# Patient Record
Sex: Male | Born: 1990 | Race: White | Hispanic: No | Marital: Single | Smoking: Never smoker
Health system: Southern US, Community
[De-identification: ages and names within clinical notes are randomized; demographics above are authoritative.]

## PROBLEM LIST (undated history)

## (undated) DIAGNOSIS — F1190 Opioid use, unspecified, uncomplicated: Secondary | ICD-10-CM

## (undated) HISTORY — DX: Opioid use, unspecified, uncomplicated: F11.90

## (undated) HISTORY — PX: TONSILLECTOMY: SUR1361

## (undated) HISTORY — PX: ADENOIDECTOMY: SHX5191

---
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
## 2016-05-15 ENCOUNTER — Emergency Department (HOSPITAL_COMMUNITY): Payer: Self-pay

## 2016-05-15 ENCOUNTER — Emergency Department (HOSPITAL_COMMUNITY)
Admission: EM | Admit: 2016-05-15 | Discharge: 2016-05-15 | Disposition: A | Discharge status: Home / Self Care | Payer: Self-pay | Attending: Emergency Medicine | Admitting: Emergency Medicine

## 2016-05-15 ENCOUNTER — Encounter (HOSPITAL_COMMUNITY): Payer: Self-pay | Admitting: Emergency Medicine

## 2016-05-15 DIAGNOSIS — Y939 Activity, unspecified: Secondary | ICD-10-CM | POA: Insufficient documentation

## 2016-05-15 DIAGNOSIS — W228XXA Striking against or struck by other objects, initial encounter: Secondary | ICD-10-CM | POA: Insufficient documentation

## 2016-05-15 DIAGNOSIS — Z79899 Other long term (current) drug therapy: Secondary | ICD-10-CM | POA: Insufficient documentation

## 2016-05-15 DIAGNOSIS — R55 Syncope and collapse: Secondary | ICD-10-CM | POA: Insufficient documentation

## 2016-05-15 DIAGNOSIS — Y999 Unspecified external cause status: Secondary | ICD-10-CM | POA: Insufficient documentation

## 2016-05-15 DIAGNOSIS — Y929 Unspecified place or not applicable: Secondary | ICD-10-CM | POA: Insufficient documentation

## 2016-05-15 LAB — RAPID URINE DRUG SCREEN, HOSP PERFORMED
Amphetamines: NOT DETECTED
BARBITURATES: NOT DETECTED
BENZODIAZEPINES: POSITIVE — AB
Cocaine: NOT DETECTED
Opiates: NOT DETECTED
Tetrahydrocannabinol: NOT DETECTED

## 2016-05-15 LAB — CBC WITH DIFFERENTIAL/PLATELET
Basophils Absolute: 0 10*3/uL (ref 0.0–0.1)
Basophils Relative: 0 %
EOS PCT: 2 %
Eosinophils Absolute: 0.1 10*3/uL (ref 0.0–0.7)
HCT: 40.8 % (ref 39.0–52.0)
Hemoglobin: 14.7 g/dL (ref 13.0–17.0)
LYMPHS ABS: 1.7 10*3/uL (ref 0.7–4.0)
LYMPHS PCT: 25 %
MCH: 28.9 pg (ref 26.0–34.0)
MCHC: 36 g/dL (ref 30.0–36.0)
MCV: 80.3 fL (ref 78.0–100.0)
MONO ABS: 0.3 10*3/uL (ref 0.1–1.0)
MONOS PCT: 5 %
Neutro Abs: 4.7 10*3/uL (ref 1.7–7.7)
Neutrophils Relative %: 68 %
PLATELETS: 205 10*3/uL (ref 150–400)
RBC: 5.08 MIL/uL (ref 4.22–5.81)
RDW: 12.5 % (ref 11.5–15.5)
WBC: 6.8 10*3/uL (ref 4.0–10.5)

## 2016-05-15 LAB — BASIC METABOLIC PANEL
Anion gap: 9 (ref 5–15)
BUN: 10 mg/dL (ref 6–20)
CALCIUM: 9.4 mg/dL (ref 8.9–10.3)
CO2: 23 mmol/L (ref 22–32)
CREATININE: 0.71 mg/dL (ref 0.61–1.24)
Chloride: 106 mmol/L (ref 101–111)
GFR calc Af Amer: >60 mL/min (ref >60)
GLUCOSE: 94 mg/dL (ref 65–99)
POTASSIUM: 4.2 mmol/L (ref 3.5–5.1)
Sodium: 138 mmol/L (ref 135–145)

## 2016-05-15 LAB — MAGNESIUM: Magnesium: 2.1 mg/dL (ref 1.7–2.4)

## 2016-05-15 MED ORDER — IBUPROFEN 400 MG PO TABS
600.0000 mg | ORAL_TABLET | Freq: Once | ORAL | Status: AC
  Administered 2016-05-15: 600 mg via ORAL
  Filled 2016-05-15: qty 1

## 2016-05-15 MED ORDER — METOCLOPRAMIDE HCL 10 MG PO TABS
10.0000 mg | ORAL_TABLET | Freq: Once | ORAL | Status: AC
  Administered 2016-05-15: 10 mg via ORAL
  Filled 2016-05-15: qty 1

## 2016-05-15 NOTE — ED Provider Notes (Signed)
[facility]-EMERGENCY DEPT Provider Note   CSN: [id number] Arrival date & time: 05/15/16  0100  By signing my name below, I, Jesus Keith, attest that this documentation has been prepared under the direction and in the presence of Tomasita CrumbleAdeleke Elan Brainerd, MD. Electronically Signed: Rosario AdieWilliam Andrew Keith, ED Scribe. 05/15/16. 2:34 AM.  History   Chief Complaint Chief Complaint  Patient presents with  . Loss of Consciousness  . Fall   The history is provided by the patient, the EMS personnel and a significant other. No language interpreter was used.    HPI Comments: Jesus Keith is a 25 y.o. male BIB EMS, with no pertinent PMHx, who presents to the Emergency Department s/p unwitnessed syncopal episode that occurred just PTA. Pt describes this episode as him becoming suddenly light-headed while in the shower and then waking up on the floor outside of his shower. His significant other at bedside reports that she found him on the ground shaking and him saying that he was cold. Pt states that he had a moderate headache while in the ED, but denies any pain otherwise. EMS noted that the pt had taken 2 doses of Trazodone and one dose of Suboxone prior to this event. He additionally reports that he has had increased uniary frequency prior to this incident over the past several days; however, no recent illnesses/infections. Denies seizure-like activity, vomiting, diarrhea, or any other associated symptoms.   Vitals en route were noted as:  BP- 118/77, HR- 64, RR- 12, SpO2- 96%, and CBG- 131.   History reviewed. No pertinent past medical history.  There are no active problems to display for this patient.  Past Surgical History:  Procedure Laterality Date  . TONSILLECTOMY      Home Medications    Prior to Admission medications   Medication Sig Start Date End Date Taking? Authorizing Provider  traZODone (DESYREL) 50 MG tablet Take 50 mg by mouth at bedtime.   Yes Historical Provider, MD   Family  History No family history on file.  Social History Social History  Substance Use Topics  . Smoking status: Never Smoker  . Smokeless tobacco: Never Used  . Alcohol use Yes   Allergies   Review of patient's allergies indicates no known allergies.  Review of Systems Review of Systems A complete 10 system review of systems was obtained and all systems are negative except as noted in the HPI and PMH.   Physical Exam Updated Vital Signs BP 109/65   Pulse (!) 57   Temp 98.4 F (36.9 C) (Oral)   Resp 19   SpO2 96%   Physical Exam  Constitutional: He is oriented to person, place, and time. Vital signs are normal. He appears well-developed and well-nourished.  Non-toxic appearance. He does not appear ill. No distress. Cervical collar in place.  HENT:  Head: Normocephalic and atraumatic.  Nose: Nose normal.  Mouth/Throat: Oropharynx is clear and moist. No oropharyngeal exudate.  Eyes: Conjunctivae and EOM are normal. Pupils are equal, round, and reactive to light. No scleral icterus.  Neck: Neck supple. No tracheal deviation, no edema, no erythema and normal range of motion present. No thyroid mass and no thyromegaly present.  Cardiovascular: Normal rate, regular rhythm, S1 normal, S2 normal, normal heart sounds, intact distal pulses and normal pulses.  Exam reveals no gallop and no friction rub.   No murmur heard. Pulmonary/Chest: Effort normal and breath sounds normal. No respiratory distress. He has no wheezes. He has no rhonchi. He has no rales.  Abdominal: Soft. Normal appearance and bowel sounds are normal. He exhibits no distension, no ascites and no mass. There is no hepatosplenomegaly. There is no tenderness. There is no rebound, no guarding and no CVA tenderness.  Musculoskeletal: Normal range of motion. He exhibits no edema or tenderness.  Lymphadenopathy:    He has no cervical adenopathy.  Neurological: He is alert and oriented to person, place, and time. He has normal  strength. No cranial nerve deficit or sensory deficit.  Normal strength and sensation to all four extremities.   Skin: Skin is warm, dry and intact. No petechiae and no rash noted. He is not diaphoretic. No erythema. No pallor.  Nursing note and vitals reviewed.  ED Treatments / Results  DIAGNOSTIC STUDIES: Oxygen Saturation is 97% on RA, normal by my interpretation.   COORDINATION OF CARE: 2:34 AM-Discussed next steps with pt. Pt verbalized understanding and is agreeable with the plan.   Labs (all labs ordered are listed, but only abnormal results are displayed) Labs Reviewed  RAPID URINE DRUG SCREEN, HOSP PERFORMED - Abnormal; Notable for the following:       Result Value   Benzodiazepines POSITIVE (*)    All other components within normal limits  CBC WITH DIFFERENTIAL/PLATELET  BASIC METABOLIC PANEL  MAGNESIUM  ETHANOL    EKG  EKG Interpretation  Date/Time:  Wednesday May 15 2016 01:08:51 EDT Ventricular Rate:  65 PR Interval:    QRS Duration: 106 QT Interval:  415 QTC Calculation: 432 R Axis:   80 Text Interpretation:  Sinus rhythm No significant change since last tracing Confirmed by Erroll Luna [phone]) on 05/15/2016 1:55:12 AM      Radiology Ct Head Wo Contrast  Result Date: 05/15/2016 CLINICAL DATA:  Syncope, struck head on toilet after getting out of shower. 5 seconds loss of consciousness. Headache and tingling and head. EXAM: CT HEAD WITHOUT CONTRAST CT CERVICAL SPINE WITHOUT CONTRAST TECHNIQUE: Multidetector CT imaging of the head and cervical spine was performed following the standard protocol without intravenous contrast. Multiplanar CT image reconstructions of the cervical spine were also generated. COMPARISON:  CT HEAD and cervical spine September 30, 2009 FINDINGS: CT HEAD FINDINGS BRAIN: The ventricles and sulci are normal. No intraparenchymal hemorrhage, mass effect nor midline shift. No acute large vascular territory infarcts. No abnormal  extra-axial fluid collections. Basal cisterns are patent. VASCULAR: Unremarkable. SKULL/SOFT TISSUES: No skull fracture. No significant soft tissue swelling. ORBITS/SINUSES: The included ocular globes and orbital contents are normal.The mastoid air-cells and included paranasal sinuses are well-aerated. OTHER: None. CT CERVICAL SPINE FINDINGS ALIGNMENT: Vertebral bodies in alignment. Maintained lordosis. Cervicothoracic levoscoliosis. SKULL BASE AND VERTEBRAE: Cervical vertebral bodies and posterior elements are intact. C5 Schmorl's nodes. C5-6 and C6-7 uncovertebral hypertrophy. Intervertebral disc heights preserved. No destructive bony lesions. C1-2 articulation maintained. SOFT TISSUES AND SPINAL CANAL: Normal. Calcified bilateral stylohyoid ligaments. DISC LEVELS: No significant osseous canal stenosis. Mild RIGHT C5-6 and C6-7 neural foraminal narrowing. UPPER CHEST: Lung apices are clear. OTHER: None. IMPRESSION: CT HEAD: Normal. CT CERVICAL SPINE: No acute fracture or malalignment. Mild at C5-6 and C6-7 neural foraminal narrowing. Electronically Signed   By: Awilda Metro M.D.   On: 05/15/2016 02:17   Ct Cervical Spine Wo Contrast  Result Date: 05/15/2016 CLINICAL DATA:  Syncope, struck head on toilet after getting out of shower. 5 seconds loss of consciousness. Headache and tingling and head. EXAM: CT HEAD WITHOUT CONTRAST CT CERVICAL SPINE WITHOUT CONTRAST TECHNIQUE: Multidetector CT imaging of the head and cervical  spine was performed following the standard protocol without intravenous contrast. Multiplanar CT image reconstructions of the cervical spine were also generated. COMPARISON:  CT HEAD and cervical spine September 30, 2009 FINDINGS: CT HEAD FINDINGS BRAIN: The ventricles and sulci are normal. No intraparenchymal hemorrhage, mass effect nor midline shift. No acute large vascular territory infarcts. No abnormal extra-axial fluid collections. Basal cisterns are patent. VASCULAR: Unremarkable.  SKULL/SOFT TISSUES: No skull fracture. No significant soft tissue swelling. ORBITS/SINUSES: The included ocular globes and orbital contents are normal.The mastoid air-cells and included paranasal sinuses are well-aerated. OTHER: None. CT CERVICAL SPINE FINDINGS ALIGNMENT: Vertebral bodies in alignment. Maintained lordosis. Cervicothoracic levoscoliosis. SKULL BASE AND VERTEBRAE: Cervical vertebral bodies and posterior elements are intact. C5 Schmorl's nodes. C5-6 and C6-7 uncovertebral hypertrophy. Intervertebral disc heights preserved. No destructive bony lesions. C1-2 articulation maintained. SOFT TISSUES AND SPINAL CANAL: Normal. Calcified bilateral stylohyoid ligaments. DISC LEVELS: No significant osseous canal stenosis. Mild RIGHT C5-6 and C6-7 neural foraminal narrowing. UPPER CHEST: Lung apices are clear. OTHER: None. IMPRESSION: CT HEAD: Normal. CT CERVICAL SPINE: No acute fracture or malalignment. Mild at C5-6 and C6-7 neural foraminal narrowing. Electronically Signed   By: Awilda Metro M.D.   On: 05/15/2016 02:17    Procedures Procedures   Medications Ordered in ED Medications  metoCLOPramide (REGLAN) tablet 10 mg (not administered)  ibuprofen (ADVIL,MOTRIN) tablet 600 mg (not administered)    Initial Impression / Assessment and Plan / ED Course  I have reviewed the triage vital signs and the nursing notes.  Pertinent labs & imaging results that were available during my care of the patient were reviewed by me and considered in my medical decision making (see chart for details).  Clinical Course   Patient presents to the ED for syncope.  CT head and neck are negative for trauma. Labs and EKG do not reveal any cause.  UDS shows benzo's which may be a cause.  Patient given IVF, reglan, and ibuprofen for headache. His neuro exam remains normal. He appears well and in NAD. VS remain within his normal limits and he is safe for DC.  Final Clinical Impressions(s) / ED Diagnoses   Final  diagnoses:  Syncope, unspecified syncope type   New Prescriptions New Prescriptions   No medications on file   I personally performed the services described in this documentation, which was scribed in my presence. The recorded information has been reviewed and is accurate.      Tomasita Crumble, MD 05/15/16 [phone]

## 2016-05-15 NOTE — ED Notes (Signed)
Patient transported to CT 

## 2016-05-15 NOTE — ED Notes (Signed)
MD at bedside. 

## 2016-05-15 NOTE — ED Triage Notes (Signed)
Per EMS, pt from home with a syncopal episode and fall. Pt felt lightheaded in the shower and fell and hit his head on the toilet. Per girlfriend, pt was "out" for about 5 seconds. Pt A&O x 4, c/o headache, and tingling in the head. EMS reports that patient took 2 trazadone and a suboxone prior to syncopal event. BP-118/77, HR-64, RR-12, SpO2-96%, CBG-131

## 2019-07-08 ENCOUNTER — Encounter (HOSPITAL_COMMUNITY): Payer: Self-pay

## 2019-07-08 ENCOUNTER — Emergency Department (HOSPITAL_COMMUNITY)
Admission: EM | Admit: 2019-07-08 | Discharge: 2019-07-09 | Disposition: A | Discharge status: Home / Self Care | Payer: Self-pay | Attending: Emergency Medicine | Admitting: Emergency Medicine

## 2019-07-08 ENCOUNTER — Other Ambulatory Visit: Payer: Self-pay

## 2019-07-08 DIAGNOSIS — F1910 Other psychoactive substance abuse, uncomplicated: Secondary | ICD-10-CM | POA: Insufficient documentation

## 2019-07-08 DIAGNOSIS — Z79899 Other long term (current) drug therapy: Secondary | ICD-10-CM | POA: Insufficient documentation

## 2019-07-08 DIAGNOSIS — F1190 Opioid use, unspecified, uncomplicated: Secondary | ICD-10-CM

## 2019-07-08 NOTE — ED Triage Notes (Signed)
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 Last used both this morning. Uses IV. He denies SI/HI. Reports feeling nauseous and achy. He is requesting to go to [facility] to "get clean"

## 2019-07-09 NOTE — ED Provider Notes (Signed)
[facility]  CSN: [id number] Arrival date & time: 07/08/19 2027  Chief Complaint(s) Detox  HPI Jesus Keith is a 28 y.o. male with h/o IVDU including heroin here [facility]. [facility] and [facility] told [facility]. He last used early this am. Also used meth, but does not regularly use meth. Denies other drug use. Denies SI/HI or AVH. No current n/v/d. No physical complaints.  HPI  Past Medical History History reviewed. No pertinent past medical history. There are no problems to display for this patient.  Home Medication(s) Prior to Admission medications   Medication Sig Start Date End Date Taking? Authorizing Provider  traZODone (DESYREL) 50 MG tablet Take 50 mg by mouth at bedtime.    [provider]                                                                                                                                    Past Surgical History Past Surgical History:  Procedure Laterality Date  . TONSILLECTOMY     Family History History reviewed. No pertinent family history.  Social History Social History   Tobacco Use  . Smoking status: Never Smoker  . Smokeless tobacco: Never Used  Substance Use Topics  . Alcohol use: Yes  . Drug use: Not on file   Allergies Patient has no known allergies.  Review of Systems Review of Systems All other systems are reviewed and are negative for acute change except as noted in the HPI  Physical Exam Vital Signs  I have reviewed the triage vital signs BP 122/86   Pulse 102   Temp 98.7 F (37.1 C)   Resp 16   SpO2 99%   Physical Exam Vitals reviewed.  Constitutional:      General: He is not in acute distress.    Appearance: He is well-developed. He is not diaphoretic.  HENT:     Head: Normocephalic and atraumatic.     Jaw: No trismus.     Right Ear: External ear normal.     Left Ear: External ear normal.      Nose: Nose normal.  Eyes:     General: No scleral icterus.    Conjunctiva/sclera: Conjunctivae normal.  Neck:     Trachea: Phonation normal.  Cardiovascular:     Rate and Rhythm: Normal rate and regular rhythm.  Pulmonary:     Effort: Pulmonary effort is normal. No respiratory distress.     Breath sounds: No stridor.  Abdominal:     General: There is no distension.  Musculoskeletal:        General: Normal range of motion.     Cervical back: Normal range of motion.  Neurological:     Mental Status: He is alert and oriented to person, place, and time.  Psychiatric:        Behavior: Behavior normal.  ED Results and Treatments Labs (all labs ordered are listed, but only abnormal results are displayed) Labs Reviewed - No data to display                                                                                                                       EKG  EKG Interpretation  Date/Time:    Ventricular Rate:    PR Interval:    QRS Duration:   QT Interval:    QTC Calculation:   R Axis:     Text Interpretation:        Radiology No results found.  Pertinent labs & imaging results that were available during my care of the patient were reviewed by me and considered in my medical decision making (see chart for details).  Medications Ordered in ED Medications - No data to display                                                                                                                                  Procedures Procedures  (including critical care time)  Medical Decision Making / ED Course I have reviewed the nursing notes for this encounter and the patient's prior records (if available in EHR or on provided paperwork).   Jesus Keith was evaluated in Emergency Department on 07/09/2019 for the symptoms described in the history of present illness. He was evaluated in the context of the global COVID-19 pandemic, which necessitated consideration that the patient  might be at risk for infection with the SARS-CoV-2 virus that causes COVID-19. Institutional protocols and algorithms that pertain to the evaluation of patients at risk for COVID-19 are in a state of rapid change based on information released by regulatory bodies including the CDC and federal and state organizations. These policies and algorithms were followed during the patient's care in the ED.  The patient appears well, in no acute distress, without evidence of toxicity or dehydration. They are interactive and following commands.  Confirmed with [facility] that we do NOT admit for detox. Provided resources.  The patient appears reasonably screened and/or stabilized for discharge and I doubt any other medical condition or other [facility] requiring further screening, evaluation, or treatment in the ED at this time prior to discharge.  The patient is safe for discharge with strict return precautions.        Final Clinical Impression(s) / ED Diagnoses Final diagnoses:  Heroin use  The patient appears reasonably screened and/or stabilized for discharge and I doubt any other medical condition or other [facility] requiring further screening, evaluation, or treatment in the ED at this time prior to discharge.  Disposition: Discharge  Condition: Good  I have discussed the results, Dx and Tx plan with the patient who expressed understanding and agree(s) with the plan. Discharge instructions discussed at great length. The patient was given strict return precautions who verbalized understanding of the instructions. No further questions at time of discharge.    ED Discharge Orders    None        Follow Up: Primary care provider  Schedule an appointment as soon as possible for a visit  If you do not have a primary care physician, contact [facility] at [phone] for referral     This chart was dictated using voice recognition software.  Despite best efforts to proofread,  errors can occur  which can change the documentation meaning.   Fatima Blank, MD 07/09/19 [phone]

## 2020-03-04 ENCOUNTER — Other Ambulatory Visit: Payer: Self-pay

## 2020-03-04 ENCOUNTER — Emergency Department (HOSPITAL_COMMUNITY)

## 2020-03-04 ENCOUNTER — Encounter (HOSPITAL_COMMUNITY): Payer: Self-pay

## 2020-03-04 ENCOUNTER — Emergency Department (HOSPITAL_COMMUNITY)
Admission: EM | Admit: 2020-03-04 | Discharge: 2020-03-05 | Attending: Emergency Medicine | Admitting: Emergency Medicine

## 2020-03-04 DIAGNOSIS — S00502A Unspecified superficial injury of oral cavity, initial encounter: Secondary | ICD-10-CM | POA: Insufficient documentation

## 2020-03-04 DIAGNOSIS — Y939 Activity, unspecified: Secondary | ICD-10-CM | POA: Insufficient documentation

## 2020-03-04 DIAGNOSIS — Y929 Unspecified place or not applicable: Secondary | ICD-10-CM | POA: Insufficient documentation

## 2020-03-04 DIAGNOSIS — Z20822 Contact with and (suspected) exposure to covid-19: Secondary | ICD-10-CM | POA: Diagnosis not present

## 2020-03-04 DIAGNOSIS — S0083XA Contusion of other part of head, initial encounter: Secondary | ICD-10-CM | POA: Diagnosis not present

## 2020-03-04 DIAGNOSIS — W19XXXA Unspecified fall, initial encounter: Secondary | ICD-10-CM | POA: Insufficient documentation

## 2020-03-04 DIAGNOSIS — R55 Syncope and collapse: Secondary | ICD-10-CM | POA: Diagnosis not present

## 2020-03-04 DIAGNOSIS — R509 Fever, unspecified: Secondary | ICD-10-CM | POA: Insufficient documentation

## 2020-03-04 DIAGNOSIS — Y999 Unspecified external cause status: Secondary | ICD-10-CM | POA: Diagnosis not present

## 2020-03-04 DIAGNOSIS — S0993XA Unspecified injury of face, initial encounter: Secondary | ICD-10-CM

## 2020-03-04 LAB — CBC WITH DIFFERENTIAL/PLATELET
Abs Immature Granulocytes: 0.04 10*3/uL (ref 0.00–0.07)
Basophils Absolute: 0 10*3/uL (ref 0.0–0.1)
Basophils Relative: 0 %
Eosinophils Absolute: 0 10*3/uL (ref 0.0–0.5)
Eosinophils Relative: 0 %
HCT: 40.6 % (ref 39.0–52.0)
Hemoglobin: 14.2 g/dL (ref 13.0–17.0)
Immature Granulocytes: 1 %
Lymphocytes Relative: 26 %
Lymphs Abs: 2.2 10*3/uL (ref 0.7–4.0)
MCH: 28 pg (ref 26.0–34.0)
MCHC: 35 g/dL (ref 30.0–36.0)
MCV: 80.1 fL (ref 80.0–100.0)
Monocytes Absolute: 0.5 10*3/uL (ref 0.1–1.0)
Monocytes Relative: 5 %
Neutro Abs: 5.7 10*3/uL (ref 1.7–7.7)
Neutrophils Relative %: 68 %
Platelets: 241 10*3/uL (ref 150–400)
RBC: 5.07 MIL/uL (ref 4.22–5.81)
RDW: 12.7 % (ref 11.5–15.5)
WBC: 8.4 10*3/uL (ref 4.0–10.5)
nRBC: 0 % (ref 0.0–0.2)

## 2020-03-04 LAB — COMPREHENSIVE METABOLIC PANEL
ALT: 26 U/L (ref 0–44)
AST: 16 U/L (ref 15–41)
Albumin: 4.8 g/dL (ref 3.5–5.0)
Alkaline Phosphatase: 53 U/L (ref 38–126)
Anion gap: 8 (ref 5–15)
BUN: 13 mg/dL (ref 6–20)
CO2: 21 mmol/L — ABNORMAL LOW (ref 22–32)
Calcium: 9.3 mg/dL (ref 8.9–10.3)
Chloride: 110 mmol/L (ref 98–111)
Creatinine, Ser: 0.7 mg/dL (ref 0.61–1.24)
GFR calc Af Amer: >60 mL/min (ref >60)
GFR calc non Af Amer: >60 mL/min (ref >60)
Glucose, Bld: 107 mg/dL — ABNORMAL HIGH (ref 70–99)
Potassium: 3.7 mmol/L (ref 3.5–5.1)
Sodium: 139 mmol/L (ref 135–145)
Total Bilirubin: 0.5 mg/dL (ref 0.3–1.2)
Total Protein: 8.1 g/dL (ref 6.5–8.1)

## 2020-03-04 LAB — CBG MONITORING, ED: Glucose-Capillary: 143 mg/dL — ABNORMAL HIGH (ref 70–99)

## 2020-03-04 MED ORDER — LIDOCAINE VISCOUS HCL 2 % MT SOLN
15.0000 mL | Freq: Once | OROMUCOSAL | Status: AC
  Administered 2020-03-05: 15 mL via OROMUCOSAL
  Filled 2020-03-04: qty 15

## 2020-03-04 NOTE — ED Triage Notes (Signed)
Pt reports falling on his face and busting some teeth out. Pt reports hitting his head. Pt endorses mouth and head pain at this time.

## 2020-03-04 NOTE — ED Provider Notes (Signed)
[facility]   CSN: [id number] Arrival date & time: 03/04/20  2148     History Chief Complaint  Patient presents with   Fall   Dental Pain    Jesus Keith is a 29 y.o. male with a history of heroin use disorder who presents to the emergency department from jail with a chief complaint of syncope.  The patient reports that he had a syncopal episode in his jail cell earlier tonight.  Reports that he hit his face during the fall and knocked out a couple of teeth.  Reports that his temperature was 100.3 when he was evaluated by the nurse practitioner after the fall tonight.  He has been having chills over the last few days.    He reports associated right lower quadrant abdominal pain.  He is unable to characterize the pain.  Reports that he has been having intermittent pain for the last few days.    He also notes that he has had intermittent rectal bleeding for several months.  Reports a 10 pound weight loss since he was incarcerated approximately 10 days ago.    He has a history of IV drug use.  Reports he was previously injecting in his right arm, but has no right arm complaints at this time.  Last use was just prior to incarceration.  The history is provided by the patient. No language interpreter was used.       History reviewed. No pertinent past medical history.  There are no problems to display for this patient.   Past Surgical History:  Procedure Laterality Date   TONSILLECTOMY         History reviewed. No pertinent family history.  Social History   Tobacco Use   Smoking status: Never Smoker   Smokeless tobacco: Never Used  Substance Use Topics   Alcohol use: Yes   Drug use: Not on file    Home Medications Prior to Admission medications   Medication Sig Start Date End Date Taking? Authorizing Provider  lidocaine (XYLOCAINE) 2 % solution Use as directed 15 mLs in the mouth or throat as needed for  mouth pain. 03/05/20   Ruxin Ransome A, PA-C    Allergies    Patient has no known allergies.  Review of Systems   Review of Systems  Constitutional: Positive for fever and unexpected weight change. Negative for appetite change, chills and diaphoresis.  HENT: Positive for dental problem and sneezing. Negative for ear discharge, ear pain, facial swelling, sinus pressure, sinus pain and sore throat.   Eyes: Negative for visual disturbance.  Respiratory: Negative for cough, shortness of breath and wheezing.   Cardiovascular: Negative for chest pain, palpitations and leg swelling.  Gastrointestinal: Positive for abdominal pain and blood in stool. Negative for diarrhea, nausea, rectal pain and vomiting.  Genitourinary: Negative for dysuria, flank pain, penile pain, penile swelling, scrotal swelling and urgency.  Musculoskeletal: Negative for back pain and myalgias.  Skin: Positive for wound. Negative for rash.  Allergic/Immunologic: Negative for immunocompromised state.  Neurological: Positive for syncope. Negative for seizures, weakness, numbness and headaches.  Psychiatric/Behavioral: Negative for confusion.    Physical Exam Updated Vital Signs BP 128/74    Pulse 81    Temp 98.5 F (36.9 C)    Resp 18    Ht 5\' 6"  (1.676 m)    Wt 63.5 kg    SpO2 100%    BMI 22.60 kg/m   Physical Exam Vitals and nursing note  reviewed.  Constitutional:      General: He is not in acute distress.    Appearance: He is well-developed. He is not ill-appearing, toxic-appearing or diaphoretic.     Comments: Nontoxic-appearing  HENT:     Head: Normocephalic.     Jaw: There is normal jaw occlusion.     Comments: Small right frontal hematoma.  Poor dentition throughout.  There is mild erythema noted across the bilateral cheeks.  No other trauma noted to the face.  No focal tenderness throughout.    Right Ear: Tympanic membrane and ear canal normal.     Left Ear: Tympanic membrane and ear canal normal.     Nose:  Nose normal. No congestion or rhinorrhea.     Mouth/Throat:     Mouth: Mucous membranes are moist.     Pharynx: No oropharyngeal exudate or posterior oropharyngeal erythema.  Eyes:     Extraocular Movements: Extraocular movements intact.     Conjunctiva/sclera: Conjunctivae normal.     Pupils: Pupils are equal, round, and reactive to light.  Cardiovascular:     Rate and Rhythm: Normal rate and regular rhythm.     Heart sounds: No murmur heard.   Pulmonary:     Effort: Pulmonary effort is normal. No respiratory distress.     Breath sounds: No stridor. No wheezing, rhonchi or rales.  Chest:     Chest wall: No tenderness.  Abdominal:     General: There is no distension.     Palpations: Abdomen is soft. There is no mass.     Tenderness: There is no abdominal tenderness. There is no right CVA tenderness, left CVA tenderness, guarding or rebound.     Hernia: No hernia is present.     Comments: Abdomen soft, nontender, nondistended.  Musculoskeletal:     Cervical back: Neck supple.     Right lower leg: No edema.     Left lower leg: No edema.     Comments: Spine is nontender to palpation without crepitus or step-offs.  Skin:    General: Skin is warm and dry.     Coloration: Skin is not jaundiced or pale.     Findings: No bruising or erythema.  Neurological:     Mental Status: He is alert.     Comments: GCS 15.  Alert and oriented x3.  Cranial nerves II through XII are grossly intact.  Moves all 4 extremities.  Speaks in complete, fluent sentences.  Follows simple commands.  Heel-to-shin is intact bilaterally. 5 out of 5 strength against resistance of the bilateral upper and lower extremities.  Sensation is intact and equal. No pronator drift.  Gait is unremarkable.   Psychiatric:        Behavior: Behavior normal.     ED Results / Procedures / Treatments   Labs (all labs ordered are listed, but only abnormal results are displayed) Labs Reviewed  COMPREHENSIVE METABOLIC PANEL -  Abnormal; Notable for the following components:      Result Value   CO2 21 (*)    Glucose, Bld 107 (*)    All other components within normal limits  URINALYSIS, ROUTINE W REFLEX MICROSCOPIC - Abnormal; Notable for the following components:   Ketones, ur 20 (*)    Bacteria, UA RARE (*)    All other components within normal limits  CBG MONITORING, ED - Abnormal; Notable for the following components:   Glucose-Capillary 143 (*)    All other components within normal limits  SARS CORONAVIRUS 2  BY RT PCR (HOSPITAL ORDER, PERFORMED IN [facility])  CULTURE, BLOOD (ROUTINE X 2)  CULTURE, BLOOD (ROUTINE X 2)  CBC WITH DIFFERENTIAL/PLATELET    EKG EKG Interpretation  Date/Time:  Saturday March 04 2020 22:49:34 EDT Ventricular Rate:  92 PR Interval:    QRS Duration: 98 QT Interval:  345 QTC Calculation: 427 R Axis:   81 Text Interpretation: Sinus rhythm Borderline short PR interval When compared with ECG of 05/15/2016, No significant change was found Confirmed by Dione Booze ([id number]) on 03/04/2020 10:58:31 PM   Radiology CT Head Wo Contrast  Result Date: 03/04/2020 CLINICAL DATA:  Head injury. Normal mental status. Patient reports falling on face with tooth injury. Struck head. EXAM: CT HEAD WITHOUT CONTRAST TECHNIQUE: Contiguous axial images were obtained from the base of the skull through the vertex without intravenous contrast. COMPARISON:  Head CT 05/15/2016 FINDINGS: Brain: No intracranial hemorrhage, mass effect, or midline shift. No hydrocephalus. The basilar cisterns are patent. No evidence of territorial infarct or acute ischemia. No extra-axial or intracranial fluid collection. Vascular: No hyperdense vessel. Skull: No fracture or focal lesion. Sinuses/Orbits: Assessed on concurrent face CT, reported separately. Other: Small right frontal scalp hematoma. IMPRESSION: Small right frontal scalp hematoma. No acute intracranial abnormality. No skull fracture. Electronically  Signed   By: Narda Rutherford M.D.   On: 03/04/2020 22:47   CT Cervical Spine Wo Contrast  Result Date: 03/04/2020 CLINICAL DATA:  Head injury. Normal mental status. Patient reports falling on face with tooth injury. Struck head.n EXAM: CT CERVICAL SPINE WITHOUT CONTRAST TECHNIQUE: Multidetector CT imaging of the cervical spine was performed without intravenous contrast. Multiplanar CT image reconstructions were also generated. COMPARISON:  CT 05/15/2016 FINDINGS: Alignment: Straightening of normal lordosis. No traumatic subluxation. Skull base and vertebrae: No acute fracture. Vertebral body heights are maintained. Schmorl's node within C5 unchanged. The dens and skull base are intact. Soft tissues and spinal canal: No prevertebral fluid or swelling. No visible canal hematoma. Disc levels: Mild disc space narrowing at C4-C5. No canal stenosis. Upper chest: Negative. Other: None. IMPRESSION: Straightening of normal lordosis may be due to positioning or muscle spasm. No acute fracture or subluxation of the cervical spine. Electronically Signed   By: Narda Rutherford M.D.   On: 03/04/2020 22:54   CT ABDOMEN PELVIS W CONTRAST  Result Date: 03/05/2020 CLINICAL DATA:  Abdominal pain fever right lower quadrant pain EXAM: CT ABDOMEN AND PELVIS WITH CONTRAST TECHNIQUE: Multidetector CT imaging of the abdomen and pelvis was performed using the standard protocol following bolus administration of intravenous contrast. CONTRAST:  OMNIPAQUE IOHEXOL 300 MG/ML  SOLN COMPARISON:  CT 09/30/2009, 08/03/2018 FINDINGS: Lower chest: No acute abnormality. Hepatobiliary: Stable subcentimeter hypodensity within the posterior right hepatic lobe near the upper pole of the right kidney, too small to further characterize but probably representing a small cyst. No calcified gallstone or biliary dilatation. Pancreas: Unremarkable. No pancreatic ductal dilatation or surrounding inflammatory changes. Spleen: Borderline enlarged.  Adrenals/Urinary Tract: Adrenal glands are unremarkable. Kidneys are normal, without renal calculi, focal lesion, or hydronephrosis. Bladder is unremarkable. Stomach/Bowel: Stomach is within normal limits. Appendix appears normal. No evidence of bowel wall thickening, distention, or inflammatory changes. Vascular/Lymphatic: No significant vascular findings are present. No enlarged abdominal or pelvic lymph nodes. Reproductive: Prostate is unremarkable. Other: No abdominal wall hernia or abnormality. No abdominopelvic ascites. Small fat containing umbilical hernia. Musculoskeletal: No acute or significant osseous findings. IMPRESSION: 1. No CT evidence for acute intra-abdominal or pelvic abnormality. Negative  for acute appendicitis. Electronically Signed   By: Jasmine Pang M.D.   On: 03/05/2020 01:41   CT Maxillofacial Wo Contrast  Result Date: 03/04/2020 CLINICAL DATA:  Head injury. Normal mental status. Patient reports falling on face with tooth injury. Mouth pain. EXAM: CT MAXILLOFACIAL WITHOUT CONTRAST TECHNIQUE: Multidetector CT imaging of the maxillofacial structures was performed. Multiplanar CT image reconstructions were also generated. COMPARISON:  None. FINDINGS: Osseous: No acute fracture of the nasal bone, zygomatic arches, or mandibles. Nasal septum is midline. The temporomandibular joints are congruent. Multiple dental caries. Occasional absent teeth but no air pocket to suggest acuity. No fracture of the maxilla. No fracture of the pterygoid plates. Orbits: No orbital fracture.  No evidence of globe injury. Sinuses: No sinus fracture or fluid level. Paranasal sinuses are clear. No mastoid effusion. Soft tissues: Minor soft tissue edema.  No soft tissue air. Limited intracranial: Assessed on concurrent head CT, reported separately. IMPRESSION: 1. No facial bone fracture. 2. Multiple dental caries. Occasional absent teeth but no air pocket to suggest acuity. Electronically Signed   By: Narda Rutherford M.D.   On: 03/04/2020 22:51    Procedures Procedures (including critical care time)  Medications Ordered in ED Medications  sodium chloride (PF) 0.9 % injection (has no administration in time range)  lidocaine (XYLOCAINE) 2 % viscous mouth solution 15 mL (15 mLs Mouth/Throat Given 03/05/20 0147)  sodium chloride 0.9 % bolus 1,000 mL (0 mLs Intravenous Stopped 03/05/20 0613)  acetaminophen (TYLENOL) tablet 650 mg (650 mg Oral Given 03/05/20 0147)  iohexol (OMNIPAQUE) 300 MG/ML solution 100 mL (100 mLs Intravenous Contrast Given 03/05/20 0123)  ibuprofen (ADVIL) tablet 600 mg (600 mg Oral Given 03/05/20 2952)    ED Course  I have reviewed the triage vital signs and the nursing notes.  Pertinent labs & imaging results that were available during my care of the patient were reviewed by me and considered in my medical decision making (see chart for details).    MDM Rules/Calculators/A&P                          29 year old male with history of heroin use disorder coming from jail and accompanied by police with multiple complaints.  His primary complaint was a syncopal episode that occurred just prior to arrival.  EKG was sinus rhythm.  Imaging is unremarkable.  Labs do not reveal a specific cause.  He has been incarcerated for 10 days and has not had substance use.  No orthostatic hypotension.  He has no headache at this time.  There was concern for loss of several teeth, but CT scan indicates there are no air pockets and this does not appear acute.  He does have a small hematoma noted to the forehead, but no other findings related to syncopal episode.  Documents also note that patient had a temperature of 100.3 orally prior to arrival.  Blood cultures sent.  He does have a history of IV drug use and has been endorsing some abdominal pain, sneezing, and rectal bleeding for an amount of time.  Abdominal exam is benign on my evaluation.  He declines rectal exam in the ER.  Given these  complaints with his history of IV drug use, CT abdomen pelvis was obtained, which was unremarkable.  He did have sneezing and borderline febrile so there was concern for COVID-19, but test was negative.  UA is not concerning for infection, but did demonstrate mild ketonuria, which was  treated with IV fluids.  Labs are otherwise unremarkable.  Although he declines a rectal exam, I recommended close follow-up for outpatient valuation after he is not incarcerated.  He has also been given a referral to a dentist.  All questions answered.  He is hemodynamically stable and in no acute distress.  ER return precautions given.  Safe for discharge to jail with outpatient follow-up as indicated.  Final Clinical Impression(s) / ED Diagnoses Final diagnoses:  Syncope, unspecified syncope type  Fall, initial encounter  Traumatic hematoma of forehead, initial encounter  Dental injury, initial encounter    Rx / DC Orders ED Discharge Orders         Ordered    lidocaine (XYLOCAINE) 2 % solution  As needed,   Status:  Discontinued     Reprint     03/05/20 0449    lidocaine (XYLOCAINE) 2 % solution  As needed     Discontinue  Reprint     03/05/20 0449           Barkley Boards, PA-C 03/05/20 0744    Dione Booze, MD 03/05/20 0745

## 2020-03-05 ENCOUNTER — Emergency Department (HOSPITAL_COMMUNITY)

## 2020-03-05 ENCOUNTER — Encounter (HOSPITAL_COMMUNITY): Payer: Self-pay

## 2020-03-05 LAB — URINALYSIS, ROUTINE W REFLEX MICROSCOPIC
Bilirubin Urine: NEGATIVE
Glucose, UA: NEGATIVE mg/dL
Hgb urine dipstick: NEGATIVE
Ketones, ur: 20 mg/dL — AB
Leukocytes,Ua: NEGATIVE
Nitrite: NEGATIVE
Protein, ur: NEGATIVE mg/dL
Specific Gravity, Urine: 1.01 (ref 1.005–1.030)
pH: 6 (ref 5.0–8.0)

## 2020-03-05 LAB — SARS CORONAVIRUS 2 BY RT PCR (HOSPITAL ORDER, PERFORMED IN ~~LOC~~ HOSPITAL LAB): SARS Coronavirus 2: NEGATIVE

## 2020-03-05 MED ORDER — LIDOCAINE VISCOUS HCL 2 % MT SOLN: 15.0000 mL | OROMUCOSAL | 0 refills | Status: DC | PRN

## 2020-03-05 MED ORDER — IBUPROFEN 200 MG PO TABS
600.0000 mg | ORAL_TABLET | Freq: Once | ORAL | Status: AC
  Administered 2020-03-05: 600 mg via ORAL
  Filled 2020-03-05: qty 3

## 2020-03-05 MED ORDER — SODIUM CHLORIDE 0.9 % IV BOLUS
1000.0000 mL | Freq: Once | INTRAVENOUS | Status: AC
  Administered 2020-03-05: 1000 mL via INTRAVENOUS

## 2020-03-05 MED ORDER — ACETAMINOPHEN 325 MG PO TABS
650.0000 mg | ORAL_TABLET | Freq: Once | ORAL | Status: AC
  Administered 2020-03-05: 650 mg via ORAL
  Filled 2020-03-05: qty 2

## 2020-03-05 MED ORDER — IOHEXOL 300 MG/ML  SOLN
100.0000 mL | Freq: Once | INTRAMUSCULAR | Status: AC | PRN
  Administered 2020-03-05: 100 mL via INTRAVENOUS

## 2020-03-05 MED ORDER — SODIUM CHLORIDE (PF) 0.9 % IJ SOLN
INTRAMUSCULAR | Status: AC
  Filled 2020-03-05: qty 50

## 2020-03-05 NOTE — Discharge Instructions (Addendum)
Thank you for allowing me to care for you today in the Emergency Department.   Your work-up today did not show a specific cause of why you passed out.  You can take 650 mg of Tylenol once every 6 hours for pain control.  For dental pain you can use 15 mL of viscous lidocaine every 3 hours as needed for pain.  You have been afebrile since arriving in the ER.  However, we did send blood cultures since you have had a fever earlier today.  You can follow-up with dentistry regarding dental injuries from your fall.  Their contact information is listed above.  Although you declined a rectal exam today, you can follow-up with primary care if you continue to have concerns for rectal bleeding.  Your CT exam today was reassuring.  Return to the emergency department if you develop persistent high fevers, respiratory distress, large amounts of black, tarry stool, new numbness or weakness, or other new, concerning symptoms.

## 2020-03-10 LAB — CULTURE, BLOOD (ROUTINE X 2)
Culture: NO GROWTH
Culture: NO GROWTH
Special Requests: ADEQUATE
Special Requests: ADEQUATE

## 2020-03-15 DIAGNOSIS — F1520 Other stimulant dependence, uncomplicated: Secondary | ICD-10-CM | POA: Insufficient documentation

## 2021-05-17 ENCOUNTER — Other Ambulatory Visit: Payer: Self-pay

## 2021-05-17 ENCOUNTER — Other Ambulatory Visit (HOSPITAL_COMMUNITY): Payer: Self-pay

## 2021-05-17 ENCOUNTER — Ambulatory Visit: Payer: Self-pay | Admitting: Internal Medicine

## 2021-05-17 ENCOUNTER — Encounter: Payer: Self-pay | Admitting: Internal Medicine

## 2021-05-17 VITALS — BP 131/68 | HR 70 | Temp 98.4°F | Ht 66.0 in | Wt 159.0 lb

## 2021-05-17 DIAGNOSIS — F1120 Opioid dependence, uncomplicated: Secondary | ICD-10-CM

## 2021-05-17 DIAGNOSIS — R21 Rash and other nonspecific skin eruption: Secondary | ICD-10-CM

## 2021-05-17 DIAGNOSIS — F1190 Opioid use, unspecified, uncomplicated: Secondary | ICD-10-CM

## 2021-05-17 MED ORDER — BUPRENORPHINE HCL-NALOXONE HCL 8-2 MG SL SUBL: 1.0000 | SUBLINGUAL_TABLET | Freq: Two times a day (BID) | SUBLINGUAL | 0 refills | Status: DC

## 2021-05-17 MED ORDER — BUPRENORPHINE HCL-NALOXONE HCL 8-2 MG SL SUBL
1.0000 | SUBLINGUAL_TABLET | Freq: Two times a day (BID) | SUBLINGUAL | 0 refills | Status: DC
  Filled 2021-05-17: qty 28, 14d supply, fill #0

## 2021-05-17 NOTE — Assessment & Plan Note (Addendum)
Patient presents to [facility] to establish care and discuss OUD.  Patient has history of polysubstance use including heroin, meth, acid, prescription medications, nicotine.  Patient started using heroin at age 30 and has been admitted to rehabilitation facilities multiple times for substance use and detox.  Patient also has history of voluntary admission with SI.  Previous diagnosis of MDD, previously on antidepressant. Patient was incarcerated at the end of 2021 into early 2022. Patient reports he has started Suboxone during admissions multiple times though has never stuck with treatment.  He most recently used heroin ~3 weeks ago.  When he uses heroin he injects in his forearms. He has experienced withdrawals from heroin in the past. He states today he would like to start Suboxone and stay straight.  This patient has Opioid Use Disorder by following DSM-V criteria:  - Opioids taken in larger amounts or over a longer period than intended - Persistent desire to cut down - Use resulting in a failure to fulfill major role obligations - Continue opioid use despite persistent social or interpersonal problems - Recurrent opioid use in situations in which it is physically hazardous - Use despite knowledge of health problems caused by opioids - Withdrawal  Current COWS: 0  Based on a review of the patient's medical history including substance use and mental health factors, and physical exam, patient is a suitable candidate for MAT with buprenorphine/naloxone.   I have discussed HIV and Hepatitis C screening with this patient. He last had hepatitis and HIV screening completed in 2021. Would ideally repeat today, however patient is self pay, will defer given financial barrier. Provided with orange card paperwork. Would perform urine toxicology today though patient is self pay, will defer for now.   Plan: -Suboxone 8-2mg  tablet BID  -Follow up in 2 weeks -Provided with Suboxone instructions -Obtain orange  card -Defer urine tox -Defer hepatitis and HIV screening

## 2021-05-17 NOTE — Progress Notes (Signed)
CC: Establish care, OUD  HPI:  Jesus Keith is a 30 y.o. male with a past medical history stated below and presents today to establish care and to Suboxone Clinic. Please see problem based assessment and plan for additional details.  No past medical history on file.  Current Outpatient Medications on File Prior to Visit  Medication Sig Dispense Refill   lidocaine (XYLOCAINE) 2 % solution Use as directed 15 mLs in the mouth or throat as needed for mouth pain. 100 mL 0   No current facility-administered medications on file prior to visit.    Family History  Problem Relation Age of Onset   Breast cancer Maternal Grandfather     Social History: Lives alone in [place] in a house.  Currently works as a Brewing technologist working mostly outdoors.  Not currently drinking alcohol, denies alcohol abuse in the past.  Uses heroin, last used 2 to 3 weeks ago.  Remote history of meth use.  Has previously used prescription opioids.  Denies tobacco use.  Vapes most days.  Review of Systems: ROS negative except for what is noted on the assessment and plan.  Vitals:   05/17/21 1404  BP: 131/68  Pulse: 70  Temp: 98.4 F (36.9 C)  TempSrc: Oral  SpO2: 100%  Weight: 159 lb (72.1 kg)  Height: 5\' 6"  (1.676 m)     Physical Exam: General: Well appearing Caucasian male, NAD HENT: normocephalic, atraumatic, MMM EYES: conjunctiva non-erythematous, no scleral icterus CV: regular rate, normal rhythm, no murmurs, rubs, gallops. Pulmonary: normal work of breathing on RA, lungs clear to auscultation, no rales, wheezes, rhonchi  Abdominal: non-distended, soft, non-tender to palpation, normal BS Skin: Warm and dry, diffuse maculopapular rash that coalesces on torso, upper and lower extremities. Neurological: MS: awake, alert and oriented x3, normal speech and fund of knowledge Motor: moves all extremities antigravity Psych: normal affect  Clinical Opiate Withdrawal Scale:    -  Resting HR:    - 0 for < 80   - 1 for 81 - 100   - 2 for 101 - 120   - 4 for > 120  - Sweating:   - 0 for no chills/flushing   - 1 for subjective chills/flushing   - 3 for beads of sweat on brow/face   - 4 for sweat streaming off of face  - Restlessness:    - 0 for able to sit still   - 1 for subjective difficulty sitting still   - 3 for frequent shifting or extraneous movement   - 5 for unable to sit still for more than a few seconds  - Pupil size:    - 0 for pinpoint or normal   - 1 for possibly larger than normal   - 2 for moderately dilated   - 5 for only iris rim visible  - Bone/joint pain:    - 0 for not present   - 1 for mild diffuse discomfort   - 2 severe diffuse aching   - 4 for objectively rubbing joints/muscles and obviously in pain  - Runny nose/tearing:    - 0 for not present   - 1 for stuffy nose/moist eyes   - 2 for nose running/tearing   - 4 for nose constantly running or tears streaming down cheeks  - GI Upset:    - 0 for no GI symptoms   - 1 for stomach cramps   - 2 for nausea or loose stool   -  3 for vomiting or diarrhea   - 5 for multiple episodes of vomiting or diarrhea  - Tremor observation of outstretched hands:    - 0 for no tremor   - 1 for tremor can be felt but not observed   - 2 for slight tremor observable   - 4 for gross tremor or muscle twitching  - Yawning:    - 0 for no yawning   - 1 for yawning once or twice during assessment   - 2 for yawning three or more times during assessment   - 4 for yawning several times per minute  - Anxiety or irritability:    - 0 for none   - 1 for patient reports increasing irritability or anxiousness   - 2 for patient obviously irritable/anxious   - 4 for patient so irritable/anxious that assessment is difficult  - Gooseflesh:    - 0 for skin is smooth   - 3 for piloerection of skin can be felt or seen   - 5 for prominent piloerection  Total: 0  Assessment & Plan:   See Encounters Tab for  problem based charting.  Patient seen with Dr. Rebbeca Paul, M.D. [facility] Internal Medicine, PGY-1 Pager: [phone] Date 05/17/2021 Time 2:31 PM

## 2021-05-17 NOTE — Patient Instructions (Addendum)
Thank you, [name] for allowing [place] to provide your care today. Today we discussed:  Opioid use: start Suboxone (instructions attached) and follow up with [place] in 2 weeks. Fill out the paperwork for the orange card. We will do a urine test at your next visit. We will check HIV, Hepatitis when you have the orange card insurance alternative.  My Chart Access: [email or web address]  Please follow-up in in 2 weeks with our Suboxone clinic.  Please make sure to arrive 15 minutes prior to your next appointment. If you arrive late, you may be asked to reschedule.    We look forward to seeing you next time. Please call our clinic at [phone] if you have any questions or concerns. The best time to call is Monday-Friday from 9am-4pm, but there is someone available 24/7. If after hours or the weekend, call the main hospital number and ask for the Internal Medicine Resident On-Call. If you need medication refills, please notify your pharmacy one week in advance and they will send [place] a request.   Thank you for letting us take part in your care. Wishing you the best!  Ellison Carwin, MD 05/17/2021, 3:27 PM IM Resident, PGY-1   Instruction for starting buprenorphine-naloxone (Suboxone) at home  You should not mix buprenorphine-naloxone with other drugs especially large amounts of alcohol or benzodiazepines (Valium, Klonopin, Xanax, Ativan). If you have taken any of these medication, please tell your healthcare team and do not take buprenorphine-naloxone.   You must wait until you are feeling signs of withdrawal from opiates (heroin, pain pills) before you take buprenorphine-naloxone.  If you do not wait long enough the medication will make you sicker.  If you do take it too soon and get sicker then wait until later when you feel signs of withdrawal listed below and then try again.   Signs that you are withdrawing: Anxiety, restlessness, can't sit  still Aches Nausea or sick to your stomach Goose-bumps Racing heart   You should have ALL of these symptoms before you start taking your first dose of buprenorphine-naloxone. If you are not sure call your healthcare team.    When it's time to take your first dose Split your pill or film in half Make sure your mouth is empty of everything (no candy/gum/etc) Sit or stand, but do not lie down Swallow a sip of water to wet your mouth  Put the half of the tablet or film under your tongue. Do not suck or swallow it. It must stay there until it is completely dissolved. Try to not even swallow your spit during this time. Anything that you swallow will not make you feel better.   In 20 minutes: You should start feeling a little better. If you feel worse then you started too early so you would wait a few hours and then try again later.    In one hour: You can take the other half of the pill or film the same way you took the first one.   In 2 hours: if you are still feeling symptoms of withdrawal listed above you can take another half a pill or film. You can repeat this if needed until you take a total of 2 pills or 2 films (16mg ). You may need less than this to control your symptoms.  You should adjust your dose so that you are taking one and half or two pills or films per day (12-16mg  per day). At this dose you should have cut down on cravings  and help with any withdrawal symptoms.   The next day:  In the morning you can take the same amount you took yesterday all at one time in the morning.  Expect a call from your team to see how you are doing.   If you have any questions or concerns at any time call your healthcare team.  Clinic Number:  830am - 5pm: [phone] After Hours Number: [phone] - - Leave your number and expect a call back from a physician.

## 2021-05-17 NOTE — Assessment & Plan Note (Signed)
Patient presents to establish care with acuate complaint of 2-3 week history of red rash. Started with cluster of red pink spots on his abdomen which then spread to include arms and legs. The spots are smooth, not raised. It is not itchy, its non-painful. It comes and partially disappears, usually worse in the evenings. He denies new personal hygiene products started at that time. He denies new diet or food started at that time. He took antibiotics recently for dental problem though rash had already started at that time. Patient had viral URI one week prior to start of symptoms.  On exam, patient has diffuse maculopapular rash that coalesces on torso, upper and lower extremities. On assessment, patient likely has pityriasis rosea given prodrome of viral illness and what sounds like a herald patch. Fortunately rash has no associated unwanted symptoms such as pruritis. Patient counseled that it may take time for rash to disappear and if it begins to itch can give [place] a call and we can prescribe short course of prednisone. Also considered viral xantham and drug eruption on the differential.

## 2021-05-31 ENCOUNTER — Encounter: Payer: Self-pay | Admitting: Student

## 2021-06-04 NOTE — Progress Notes (Signed)
Internal Medicine Clinic Attending ° °I saw and evaluated the patient.  I personally confirmed the key portions of the history and exam documented by Dr. Zinoviev and I reviewed pertinent patient test results.  The assessment, diagnosis, and plan were formulated together and I agree with the documentation in the resident’s note.  °

## 2021-06-06 ENCOUNTER — Other Ambulatory Visit (HOSPITAL_COMMUNITY): Payer: Self-pay

## 2021-06-06 ENCOUNTER — Encounter: Payer: Self-pay | Admitting: Student

## 2021-06-06 ENCOUNTER — Ambulatory Visit (INDEPENDENT_AMBULATORY_CARE_PROVIDER_SITE_OTHER): Payer: Self-pay | Admitting: Student

## 2021-06-06 ENCOUNTER — Other Ambulatory Visit: Payer: Self-pay

## 2021-06-06 VITALS — BP 131/65 | HR 87 | Temp 97.9°F | Ht 66.0 in | Wt 161.8 lb

## 2021-06-06 DIAGNOSIS — F1120 Opioid dependence, uncomplicated: Secondary | ICD-10-CM

## 2021-06-06 DIAGNOSIS — F1190 Opioid use, unspecified, uncomplicated: Secondary | ICD-10-CM

## 2021-06-06 MED ORDER — BUPRENORPHINE HCL-NALOXONE HCL 8-2 MG SL SUBL
1.0000 | SUBLINGUAL_TABLET | Freq: Two times a day (BID) | SUBLINGUAL | 0 refills | Status: DC
  Filled 2021-06-06: qty 28, 14d supply, fill #0

## 2021-06-06 NOTE — Patient Instructions (Signed)
Jesus Keith, it was a pleasure seeing you today!  Today we discussed: - Suboxone: We have ordered a two weeks supply to be picked up at the [facility].  - If you have any constipation, you can pick up senna at your local pharmacy.  I have ordered the following labs today:   Lab Orders         ToxAssure Select,+Antidepr,UR       Follow-up:  2 weeks    Please make sure to arrive 15 minutes prior to your next appointment. If you arrive late, you may be asked to reschedule.   We look forward to seeing you next time. Please call our clinic at [phone] if you have any questions or concerns. The best time to call is Monday-Friday from 9am-4pm, but there is someone available 24/7. If after hours or the weekend, call the main hospital number and ask for the Internal Medicine Resident On-Call. If you need medication refills, please notify your pharmacy one week in advance and they will send [place] a request.  Thank you for letting us take part in your care. Wishing you the best!  Thank you, Evlyn Kanner, MD

## 2021-06-06 NOTE — Progress Notes (Signed)
   CC: OUD follow-up  HPI:  [name] is a 30 y.o. with opioid use disorder presenting to [facility] for OUD follow-up.  Past Medical History:  Diagnosis Date   Opioid use disorder    Review of Systems:  As per HPI  Physical Exam:  Vitals:   06/06/21 1323  BP: 131/65  Pulse: 87  Temp: 97.9 F (36.6 C)  TempSrc: Oral  SpO2: 98%  Weight: 161 lb 12.8 oz (73.4 kg)  Height: 5\' 6"  (1.676 m)   General: Resting comfortably in chair, no acute distress CV: Regular rate, rhythm. No murmurs appreciated. Pulm: Normal work of breathing on room air. Clear to ausculation bilaterally. MSK: Normal bulk, tone. No pitting edema bilaterally. Neuro: Awake, alert, conversing appropriately. No focal deficits. Psych: Normal mood, affect, speech.  Assessment & Plan:   See Encounters Tab for problem based charting.  Patient discussed with Dr. 

## 2021-06-07 ENCOUNTER — Telehealth: Payer: Self-pay | Admitting: *Deleted

## 2021-06-07 NOTE — Telephone Encounter (Signed)
Patient called in requesting letter addressed to his Jesus Keith, stating he is in treatment for OUD and is taking proper steps to deal with his addiction. He has court tomorrow at 1000, and is requesting letter be faxed to [phone].

## 2021-06-08 ENCOUNTER — Encounter: Payer: Self-pay | Admitting: Student

## 2021-06-08 NOTE — Telephone Encounter (Signed)
Looks like Dr. Marijo Conception took care of this. Thank you.

## 2021-06-09 ENCOUNTER — Encounter: Payer: Self-pay | Admitting: Student

## 2021-06-09 NOTE — Assessment & Plan Note (Signed)
Patient is presenting to [facility] for OUD follow-up. Since last visit, patient reports he has been doing well on his current dose. He has been taking Suboxone 8-2mg  1.5-2 times daily. Notes his cravings have been well-controlled. Denies any relapses over this time. Also mentions that he has been taking the right steps in order to "get my life together." He has not had issues getting Suboxone.   - Re-fill Suboxone 8-2mg  twice daily  - ToxAssure today - Return to clinic in two weeks - Information given to get free HIV and hepatitis C screenings at health department

## 2021-06-12 NOTE — Progress Notes (Signed)
Internal Medicine Clinic Attending ? ?Case discussed with Dr. Braswell  At the time of the visit.  We reviewed the resident?s history and exam and pertinent patient test results.  I agree with the assessment, diagnosis, and plan of care documented in the resident?s note.  ?

## 2021-06-14 LAB — TOXASSURE SELECT,+ANTIDEPR,UR

## 2021-07-09 ENCOUNTER — Encounter: Payer: Self-pay | Admitting: Internal Medicine

## 2021-07-09 ENCOUNTER — Ambulatory Visit (INDEPENDENT_AMBULATORY_CARE_PROVIDER_SITE_OTHER): Payer: Self-pay | Admitting: Internal Medicine

## 2021-07-09 ENCOUNTER — Other Ambulatory Visit: Payer: Self-pay

## 2021-07-09 ENCOUNTER — Other Ambulatory Visit (HOSPITAL_COMMUNITY): Payer: Self-pay

## 2021-07-09 VITALS — BP 128/65 | HR 71 | Temp 97.5°F | Ht 66.0 in | Wt 144.0 lb

## 2021-07-09 DIAGNOSIS — F1120 Opioid dependence, uncomplicated: Secondary | ICD-10-CM

## 2021-07-09 DIAGNOSIS — F1190 Opioid use, unspecified, uncomplicated: Secondary | ICD-10-CM

## 2021-07-09 MED ORDER — BUPRENORPHINE HCL-NALOXONE HCL 8-2 MG SL SUBL
1.0000 | SUBLINGUAL_TABLET | Freq: Two times a day (BID) | SUBLINGUAL | 1 refills | Status: DC
  Filled 2021-07-09: qty 28, 14d supply, fill #0
  Filled 2021-07-23: qty 28, 14d supply, fill #1

## 2021-07-09 NOTE — Progress Notes (Signed)
° °  CC: follow up  HPI:  Jesus Keith is a 30 y.o. with medical history as below presenting to [facility] for follow up.   Please see problem-based list for further details, assessments, and plans.  Past Medical History:  Diagnosis Date   Opioid use disorder    Review of Systems:  Review of system negative unless stated in the problem list or HPI.    Physical Exam:  Vitals:   07/09/21 1403  BP: 128/65  Pulse: 71  Temp: (!) 97.5 F (36.4 C)  TempSrc: Oral  SpO2: 98%  Weight: 144 lb (65.3 kg)  Height: 5\' 6"  (1.676 m)    Physical Exam General: Well-developed, NAD Head: Normocephalic without scalp lesions.  Eyes: PERRLA, Conjunctivae pink, sclerae white, without icterus.  Mouth and Throat: Lips normal color, without lesions. Moist mucus membrane. Neck: Neck supple with full range of motion (ROM). No masses or tenderness.   Lungs: CTAB, no wheeze, rhonchi or rales.  Cardiovascular: Normal heart sounds, no r/m/g, 2+ pulses in all extremities. No LE edema Abdomen: No TTP, normal bowel sounds MSK: No asymmetry or muscle atrophy. Full range of motion (ROM) of all joints. No injuries noted. Strength 5/5 in all extremities.  Skin: warm, dry good skin turgor, no lesions noted Neuro: Alert and oriented. CN grossly intact Psych: Normal mood and normal affect   Assessment & Plan:   See Encounters Tab for problem based charting.  Patient seen with Dr.  , MD   Opioid Use Disorder HR: Suboxone 8-2 mg BID. Started 05/17/21. Utox in 05/2021 showed buprenorphine only. Missed one week's worth during 11/28 when in jail.  Had some slight withdrawal with sweats during the time in jail.

## 2021-07-09 NOTE — Patient Instructions (Addendum)
[name], it was a pleasure seeing you today! You endorsed feeling well today. Below are some of the things we talked about this visit. We look forward to seeing you in the follow up appointment!  Today we discussed: You presented for follow for opioid use disorder. Since starting the Suboxone you stated you have not had any relapses. The cravings have decreased. Your only complaint was constipation. Please try to decrease your usage of Suboxone as tolerated with no more than 2 tablets per day. For the constipation, you can use miralax as needed.   I advise you to complete the Hepatitis C and HIV testing at the health department.   We look forward to seeing you in one month.   I have ordered the following labs today:  Lab Orders  No laboratory test(s) ordered today      Referrals ordered today:   Referral Orders  No referral(s) requested today     I have ordered the following medication/changed the following medications:   Stop the following medications: Medications Discontinued During This Encounter  Medication Reason   buprenorphine-naloxone (SUBOXONE) 8-2 mg SUBL SL tablet Reorder     Start the following medications: Meds ordered this encounter  Medications   buprenorphine-naloxone (SUBOXONE) 8-2 mg SUBL SL tablet    Sig: Place 1 tablet under the tongue in the morning and at bedtime.    Dispense:  28 tablet    Refill:  1    NADEAN:[id number]  IM program     Follow-up: 1 month follow up   Please make sure to arrive 15 minutes prior to your next appointment. If you arrive late, you may be asked to reschedule.   We look forward to seeing you next time. Please call our clinic at [phone] if you have any questions or concerns. The best time to call is Monday-Friday from 9am-4pm, but there is someone available 24/7. If after hours or the weekend, call the main hospital number and ask for the Internal Medicine Resident On-Call. If you need medication refills,  please notify your pharmacy one week in advance and they will send [place] a request.  Thank you for letting us take part in your care. Wishing you the best!  Thank you, Gwenevere Abbot, MD

## 2021-07-10 NOTE — Assessment & Plan Note (Signed)
Assessment/Plan: Patient has opioid use disorder that seem to be improving. He was started on Suboxone 8-2 mg BID. He was given 2 weeks worth of medication last month and states he took the last remaining few days ago. He missed around a week's worth when he was in jail. He stated he had slight withdrawal symptoms at that time including increased sweating but nothing compared to when he was using heroin. Last IV heroin used in 05/2020 and intranasally in 04/2021 before starting Suboxone. Currently denies cravings, states only thing bothering him is constipation.  -Refill Suboxone 8-2 mg BID (advised to decreased as much as tolerate) -Miralax PRN for bowel movements -One month follow up with Utox; deferred this visit due to low recent intake

## 2021-07-18 NOTE — Progress Notes (Signed)
Internal Medicine Clinic Attending  I saw and evaluated the patient.  I personally confirmed the key portions of the history and exam documented by Dr. Khan and I reviewed pertinent patient test results.  The assessment, diagnosis, and plan were formulated together and I agree with the documentation in the resident's note.  

## 2021-07-23 ENCOUNTER — Other Ambulatory Visit (HOSPITAL_COMMUNITY): Payer: Self-pay

## 2021-07-27 ENCOUNTER — Other Ambulatory Visit (HOSPITAL_COMMUNITY): Payer: Self-pay

## 2021-08-08 ENCOUNTER — Encounter: Payer: Self-pay | Admitting: Internal Medicine

## 2021-08-09 ENCOUNTER — Encounter: Payer: Self-pay | Admitting: Internal Medicine

## 2021-08-14 ENCOUNTER — Encounter: Payer: Self-pay | Admitting: Internal Medicine

## 2021-08-14 ENCOUNTER — Ambulatory Visit: Payer: Self-pay | Admitting: Internal Medicine

## 2021-08-14 VITALS — BP 127/74 | HR 66 | Temp 98.7°F | Wt 143.6 lb

## 2021-08-14 DIAGNOSIS — R634 Abnormal weight loss: Secondary | ICD-10-CM | POA: Insufficient documentation

## 2021-08-14 DIAGNOSIS — F1190 Opioid use, unspecified, uncomplicated: Secondary | ICD-10-CM

## 2021-08-14 DIAGNOSIS — Z0000 Encounter for general adult medical examination without abnormal findings: Secondary | ICD-10-CM | POA: Insufficient documentation

## 2021-08-14 DIAGNOSIS — F1120 Opioid dependence, uncomplicated: Secondary | ICD-10-CM

## 2021-08-14 NOTE — Progress Notes (Signed)
°  CC: OUD follow up  HPI:  Jesus Keith is a 31 y.o. male with a past medical history stated below and presents today for OUD follow up. Please see problem based assessment and plan for additional details.  Past Medical History:  Diagnosis Date   Opioid use disorder     Current Outpatient Medications on File Prior to Visit  Medication Sig Dispense Refill   buprenorphine-naloxone (SUBOXONE) 8-2 mg SUBL SL tablet Place 1 tablet under the tongue in the morning and at bedtime. 28 tablet 1   No current facility-administered medications on file prior to visit.    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  Reports his incarceration back in November was related to breaking parole for charges that occurred in his past.  Has not had any further altercations with the law.  Reports has significant other who is supportive.  Review of Systems: ROS negative except for what is noted on the assessment and plan.  Vitals:   08/14/21 1000  BP: 127/74  Pulse: 66  Temp: 98.7 F (37.1 C)  TempSrc: Oral  SpO2: 100%  Weight: 143 lb 9.6 oz (65.1 kg)     Physical Exam: General: Well appearing Caucasian male, NAD HENT: normocephalic, atraumatic EYES: conjunctiva non-erythematous, no scleral icterus CV: regular rate, normal rhythm, no murmurs, rubs, gallops.  No lower extremity edema. Pulmonary: normal work of breathing on RA, lungs clear to auscultation, no rales, wheezes, rhonchi Abdominal: non-distended, soft, non-tender to palpation, normal BS Skin: Warm and dry, no rashes or lesions Neurological: MS: awake, alert and oriented x3, normal speech and fund of knowledge Motor: moves all extremities antigravity Psych: normal affect    Assessment & Plan:   See Encounters Tab for problem based charting.  Patient discussed with Dr.   Reinaldo Meeker, M.D. [facility] Internal Medicine, PGY-1 Pager: [phone] Date 08/14/2021 Time 10:13 AM

## 2021-08-14 NOTE — Assessment & Plan Note (Addendum)
Patient presents for OB for IUD follow-up.  He is on Suboxone 8-2 mg twice daily.  Last took Suboxone yesterday.  Reports no missed doses since last appointment.  Has not used other opioids since starting Suboxone, last IV heroin use 2021, last other opioid drug use October 2022 prior to starting Suboxone.  Patient's only other substance use is nicotine vaping.  He reports currently doing well on Suboxone.  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  Reports his incarceration back in November was related to breaking parole for charges that occurred in his past.  Has not had any further altercations with the law.  Reports has significant other who is supportive.  Patient denies problems with medication, withdrawal symptoms.  He provides a urine sample today.  He has previously had constipation while on Suboxone which he has been able to manage with increasing fluid intake.  No new medical complaints.  Plan: -Tox assure urine drug test today -Refill Suboxone following results of drug test -Follow-up in 1 month, can do telehealth visits alternating with in person visits from here on out, with urine drug test at each in person visit  ADDENDUM: Patient's tox assure appropriately showed presence of buprenorphine.  Unfortunately, it also showed presence of methamphetamine/amphetamine.  Attempted to contact patient 1/31 however "call could not be completed", suggesting cancellation of service or recipients phone unable to connect to service at this time.  Patient needs to be contacted with this result, advised not to take the substances concurrently with buprenorphine, and will need in-person monthly appointments again with urine drug screen each month.

## 2021-08-14 NOTE — Patient Instructions (Signed)
Thank you, [name] for allowing [place] to provide your care today. Today we discussed:  Opioid use disorder: Continue Suboxone 8-2 mg twice daily.  Once you are urine test comes back we can reorder the Suboxone.  This might take a couple days.  Weight loss: We will continue to monitor your weight.  For now continue your protein shakes and trying to eat substantial meals.  I have ordered the following labs for you:   Lab Orders         ToxAssure Select,+Antidepr,UR      My Chart Access: [email or web address]  Please follow-up in 1 month.  We can do 1 month follow ups, alternating between televisit and in person office visit.  You would give a urine sample every 2 months during your in person visits.  If you prefer to come in person that is okay too.  Please make sure to arrive 15 minutes prior to your next appointment. If you arrive late, you may be asked to reschedule.    We look forward to seeing you next time. Please call our clinic at [phone] if you have any questions or concerns. The best time to call is Monday-Friday from 9am-4pm, but there is someone available 24/7. If after hours or the weekend, call the main hospital number and ask for the Internal Medicine Resident On-Call. If you need medication refills, please notify your pharmacy one week in advance and they will send [place] a request.   Thank you for letting us take part in your care. Wishing you the best!  Ellison Carwin, MD 08/14/2021, 10:31 AM IM Resident, PGY-1

## 2021-08-14 NOTE — Assessment & Plan Note (Signed)
Patient reports he has noticed some weight loss in the past weeks.  On chart review, patient lost 17 pounds from October to December.  Since December his weight has been stable at 140s pounds.  Patient has been trying to keep up his weight by drinking protein shakes.  He eats breakfast and dinner though usually is too busy with work to have lunch though is able to pick up some snacks and his protein shakes if needed.  When asked why patient might be losing weight, he reports probably due to his busy schedule.  Reports past history of depression for which he took an antidepressant which he is not currently on.  PHQ-9 score of 0 today, denies persistent mood symptoms.  Not currently taking any other medications.  On assessment, patient's weight has been stable over the last month, though had some weight loss from October to December.  He is managing this at home with protein shakes and snacks.  We will continue to monitor.  Plan: -Protein shakes, snacks as needed -Continue to monitor weight trends at follow-up appointments

## 2021-08-14 NOTE — Assessment & Plan Note (Signed)
Patient is uninsured.  Does not receive health insurance through his job.  Discussed obtaining HIV test, hep C test, tetanus booster, flu vaccine through the health department.  Patient reports still has information papers provided from last OV on this and will try to make time to get these taken care of.

## 2021-08-15 ENCOUNTER — Other Ambulatory Visit (HOSPITAL_COMMUNITY): Payer: Self-pay

## 2021-08-15 MED ORDER — BUPRENORPHINE HCL-NALOXONE HCL 8-2 MG SL SUBL
1.0000 | SUBLINGUAL_TABLET | Freq: Two times a day (BID) | SUBLINGUAL | 1 refills | Status: DC
  Filled 2021-08-15: qty 28, 14d supply, fill #0
  Filled 2021-09-14: qty 28, 14d supply, fill #1

## 2021-08-15 NOTE — Addendum Note (Signed)
Addended by: Derrek Monaco on: 08/15/2021 08:45 AM   Modules accepted: Orders

## 2021-08-15 NOTE — Progress Notes (Signed)
Internal Medicine Clinic Attending  Case discussed with Dr. Sharene Butters  At the time of the visit.  We reviewed the residents history and exam and pertinent patient test results.  I agree with the assessment, diagnosis, and plan of care documented in the residents note.   Suboxone sent to [facility]. Dr. Sharene Butters will follow up UDS results.

## 2021-08-20 LAB — TOXASSURE SELECT,+ANTIDEPR,UR

## 2021-09-11 ENCOUNTER — Telehealth: Payer: Self-pay

## 2021-09-14 ENCOUNTER — Other Ambulatory Visit (HOSPITAL_COMMUNITY): Payer: Self-pay

## 2021-09-25 ENCOUNTER — Other Ambulatory Visit: Payer: Self-pay

## 2021-09-25 ENCOUNTER — Ambulatory Visit (INDEPENDENT_AMBULATORY_CARE_PROVIDER_SITE_OTHER): Payer: Self-pay | Admitting: Student in an Organized Health Care Education/Training Program

## 2021-09-25 DIAGNOSIS — F1120 Opioid dependence, uncomplicated: Secondary | ICD-10-CM

## 2021-09-25 NOTE — Assessment & Plan Note (Signed)
On treatment for about 4 months with some recent issues. He reports relapse with recent drug use, but was not forthcoming of any details and abruptly stopped answering my questions on the phone. It was hard to tell if this was maybe an issue with the phone, or the environment he was speaking from, or an issue with him. I will hold off on prescribing any more suboxone for now. We will be available if he calls back, but I would like him seen in person so we can better assess how he is doing with treatment. The televisit format is not working in his case. ?

## 2021-09-25 NOTE — Progress Notes (Signed)
?  [facility] Internal Medicine Residency Telephone Encounter ?Continuity Care Appointment ? ?HPI:  ?This telephone encounter was created for Jesus Keith on 09/25/2021 for the following purpose/cc OUD. ? ?Unusual phone visit for this 31 year old person calling for follow up of OUD. He missed our telephone visit on 2/21, it turns out he has changed his phone number and the one we had on file was incorrect. He had a several day interruption in suboxone towards the end of January likely due to this issue with his phone. He had a refill over the phone on 2/24 and reports he has been taking 1.5 films more consistently the last several days. He tells me he has been on suboxone for about 4 months now. He was not forthcoming when I asked if he had cravings or symptoms of withdrawal. I asked if he had any recent relapses, he paused for a while, I had to ask the question three times and he said yes. He didn't say anything further on the phone with me. I asked if he was there, he said yes, there was background noise that sounded like he was moving rooms. He wouldn't answer any of my further questions. I told him that it sounded like he wasn't in a place that he could talk and I told him to call us back later if he gets in a better place.  ? ?  ? ?Past Medical History:  ?Past Medical History:  ?Diagnosis Date  ? Opioid use disorder   ?  ? ? ?Assessment / Plan / Recommendations:  ?Please see A&P under problem oriented charting for assessment of the patient's acute and chronic medical conditions.  ?As always, pt is advised that if symptoms worsen or new symptoms arise, they should go to an urgent care facility or to to ER for further evaluation.  ? ?Consent and Medical Decision Making:  ? ?This is a telephone encounter between Dalbert Batman and Tyson Alias on 09/25/2021 for OUD. The visit was conducted with the patient located at home and Tyson Alias at [facility]. The patient's identity was confirmed using their  DOB and current address. The patient has consented to being evaluated through a telephone encounter and understands the associated risks (an examination cannot be done and the patient may need to come in for an appointment) / benefits (allows the patient to remain at home, decreasing exposure to coronavirus). I personally spent 6 minutes on medical discussion.   ? ? ?

## 2021-09-27 ENCOUNTER — Ambulatory Visit: Payer: Self-pay | Admitting: Student

## 2021-09-27 ENCOUNTER — Other Ambulatory Visit (HOSPITAL_COMMUNITY): Payer: Self-pay

## 2021-09-27 DIAGNOSIS — F1190 Opioid use, unspecified, uncomplicated: Secondary | ICD-10-CM

## 2021-09-27 DIAGNOSIS — R21 Rash and other nonspecific skin eruption: Secondary | ICD-10-CM

## 2021-09-27 DIAGNOSIS — F1120 Opioid dependence, uncomplicated: Secondary | ICD-10-CM

## 2021-09-27 MED ORDER — BUPRENORPHINE HCL-NALOXONE HCL 8-2 MG SL SUBL
1.0000 | SUBLINGUAL_TABLET | Freq: Two times a day (BID) | SUBLINGUAL | 1 refills | Status: DC
  Filled 2021-09-27 – 2021-10-11 (×2): qty 28, 14d supply, fill #0
  Filled 2021-11-23: qty 28, 14d supply, fill #1

## 2021-09-27 NOTE — Patient Instructions (Signed)
Mr.Jasn Avie Arenas, it was a pleasure seeing you today! ? ?Today we discussed: ?- Suboxone: We will go ahead and refill your dosage for two weeks. We'd like to see you back in person at that time. ? ?- If you develop new symptoms with the rash, please let us know. We will talk with our dermatologists to determine next best steps.  ? ?Follow-up:  2 weeks   ? ?Please make sure to arrive 15 minutes prior to your next appointment. If you arrive late, you may be asked to reschedule.  ? ?We look forward to seeing you next time. Please call our clinic at [phone] if you have any questions or concerns. The best time to call is Monday-Friday from 9am-4pm, but there is someone available 24/7. If after hours or the weekend, call the main hospital number and ask for the Internal Medicine Resident On-Call. If you need medication refills, please notify your pharmacy one week in advance and they will send [place] a request. ? ?Thank you for letting us take part in your care. Wishing you the best! ? ?Thank you, ?Sanjuan Dame, MD ?

## 2021-09-29 DIAGNOSIS — R21 Rash and other nonspecific skin eruption: Secondary | ICD-10-CM | POA: Insufficient documentation

## 2021-09-29 NOTE — Progress Notes (Signed)
? ?  CC: OUD follow-up ? ?HPI: ? ?[name] is a 31 y.o. person with opioid use disorder presenting to [facility] for OUD follow-up. ? ?Please see problem-based list for further details, assessments, and plans. ? ?Past Medical History:  ?Diagnosis Date  ? Opioid use disorder   ? ?Review of Systems:  As per HPI ? ?Physical Exam: ? ?Vitals:  ? 09/27/21 0929  ?BP: 128/75  ?Pulse: 77  ?Temp: 98.1 ?F (36.7 ?C)  ?TempSrc: Oral  ?SpO2: 100%  ?Weight: 144 lb 1.6 oz (65.4 kg)  ? ?General: Resting comfortably in no acute distress ?CV: Regular rate, rhythm. No murmurs appreciated. ?Pulm: Normal respiratory effort on room air. Clear to ausculation bilaterally. ?Skin: Diffuse morbiliform rash throughout abdomen and back. No draining or bleeding lesions appreciated. (Pictures located under "Media" tab) ?Neuro: Awake, alert, conversing appropriately. ?Psych: Normal mood, affect, speech. ? ?Assessment & Plan:  ? ?Severe opioid use disorder (HCC) ?Patient is seen in clinic today for OUD follow-up. He reports he relapsed earlier this week on Monday. Describes running into a friend from his past and subsequently using IV heroin twice that day. Notes that he did use needles that others have not used. Denies any other drug use or other relapses. Usually his girlfriend is his support system, but she was not with him that day. He reports regular use of his suboxone twice daily. States this dose covers his cravings well. Mentions normal bowel movements once daily.  ? ?Appears patient's relapse was a consequence of his environment. Patient appears to be motivated to continue with suboxone treatment. We will obtain ToxAssure today and have him return to clinic in two weeks. ? ?- Suboxone 8-2mg  twice daily ?- ToxAssure today ?- Return to clinic in two weeks ? ?Morbilliform rash ?Patient reports a rash on his abdomen and back for roughly the last week. He mentions this rash has occurred in the past once before with spontaneous resolution after  two weeks. He describes the rash as non-pruritic, non-painful, and worsening with heat. When the rash worsens, it spreads to his chest and bilateral arms. He denies any changes in detergents or soaps, medication changes, or recent illnesses. Also denies fevers, chills, body aches, dyspnea, abdominal pain, changes in bowel movements. ? ?Unclear etiology of patient's rash. It is reassuring the rash is non-pruritic and non-painful without any associated symptoms. Will consult [facility] for further recommendations. Discussed with patient to return to clinic if rash worsens or he experiences new symptoms. ? ?- [facility] consult pending ? ?Patient discussed with Dr.  Sol Blazing ? ?Evlyn Kanner, MD ?Internal Medicine PGY-2 ?Pager: [phone] ? ?

## 2021-09-29 NOTE — Assessment & Plan Note (Signed)
Patient reports a rash on his abdomen and back for roughly the last week. He mentions this rash has occurred in the past once before with spontaneous resolution after two weeks. He describes the rash as non-pruritic, non-painful, and worsening with heat. When the rash worsens, it spreads to his chest and bilateral arms. He denies any changes in detergents or soaps, medication changes, or recent illnesses. Also denies fevers, chills, body aches, dyspnea, abdominal pain, changes in bowel movements.  Unclear etiology of patient's rash. It is reassuring the rash is non-pruritic and non-painful without any associated symptoms. Will consult [facility] for further recommendations. Discussed with patient to return to clinic if rash worsens or he experiences new symptoms.  - [facility] consult pending

## 2021-09-29 NOTE — Assessment & Plan Note (Addendum)
Patient is seen in clinic today for OUD follow-up. He reports he relapsed earlier this week on Monday. Describes running into a friend from his past and subsequently using IV heroin twice that day. Notes that he did use needles that others have not used. Denies any other drug use or other relapses. Usually his girlfriend is his support system, but she was not with him that day. He reports regular use of his suboxone twice daily. States this dose covers his cravings well. Mentions normal bowel movements once daily.  ? ?Appears patient's relapse was a consequence of his environment. Patient appears to be motivated to continue with suboxone treatment. We will obtain ToxAssure today and have him return to clinic in two weeks. ? ?- Suboxone 8-2mg  twice daily ?- ToxAssure today ?- Return to clinic in two weeks ?

## 2021-10-01 NOTE — Progress Notes (Signed)
Internal Medicine Clinic Attending ? ?Case discussed with Dr. Braswell  At the time of the visit.  We reviewed the resident?s history and exam and pertinent patient test results.  I agree with the assessment, diagnosis, and plan of care documented in the resident?s note.  ?

## 2021-10-03 LAB — TOXASSURE SELECT,+ANTIDEPR,UR

## 2021-10-05 ENCOUNTER — Other Ambulatory Visit (HOSPITAL_COMMUNITY): Payer: Self-pay

## 2021-10-11 ENCOUNTER — Other Ambulatory Visit (HOSPITAL_COMMUNITY): Payer: Self-pay

## 2021-11-13 IMAGING — CT CT MAXILLOFACIAL W/O CM
3 series · 15 of 47 positions shown, 18 images · non-contrast
Comparison: None.

CLINICAL DATA: Head injury. Normal mental status. Patient reports
falling on face with tooth injury. Mouth pain.

EXAM:
CT MAXILLOFACIAL WITHOUT CONTRAST
TECHNIQUE: Multidetector CT imaging of the maxillofacial structures was
performed. Multiplanar CT image reconstructions were also generated.

[Series 3: max soft · axial · 0.34mm/px · z∈[-238,-108]mm · 9 of 77 slices shown, 12 images]
[im 6/77  brain]
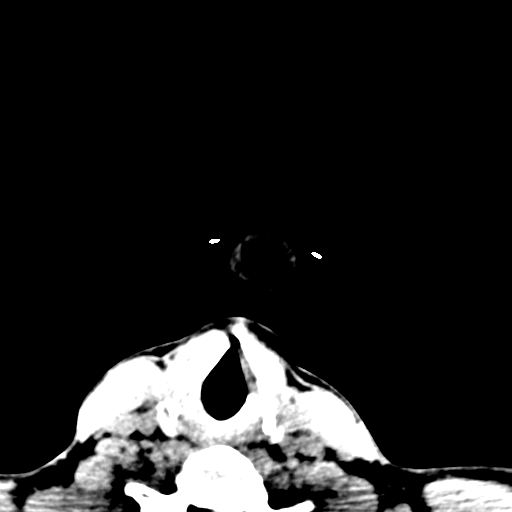
[im 6/77  bone]
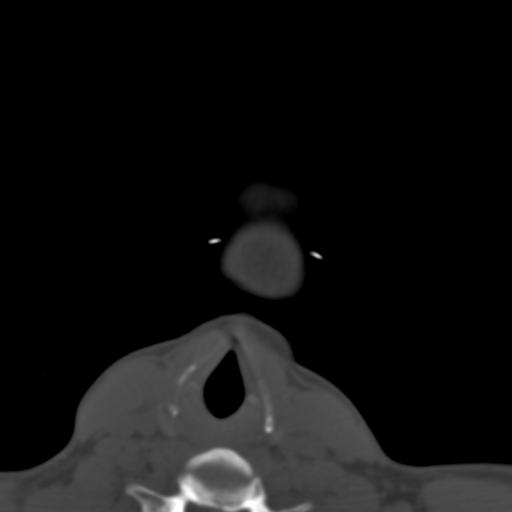
[im 14/77  bone]
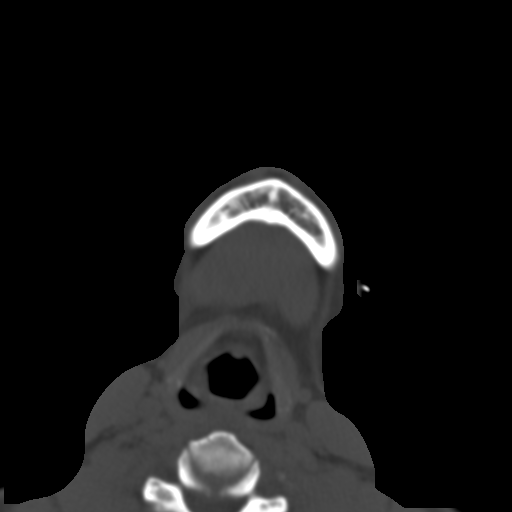
[im 21/77  bone]
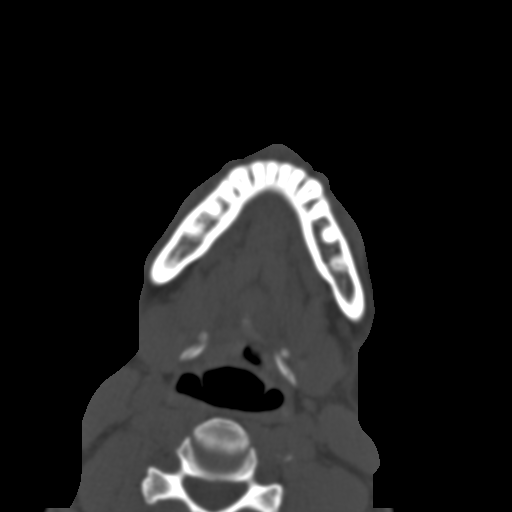
[im 29/77  bone]
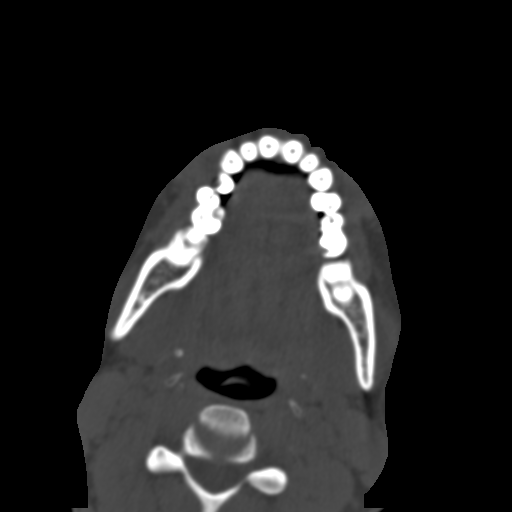
[im 40/77  brain]
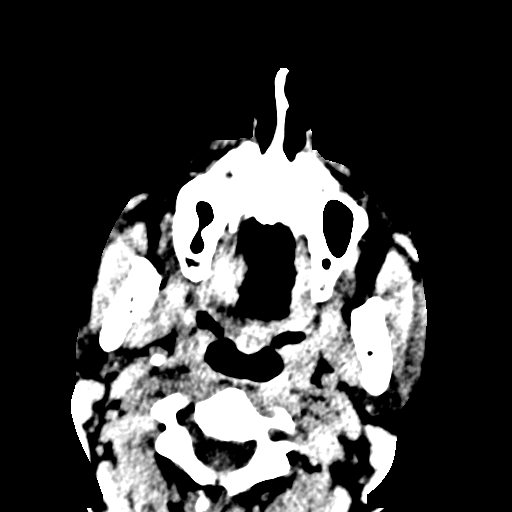
[im 40/77  bone]
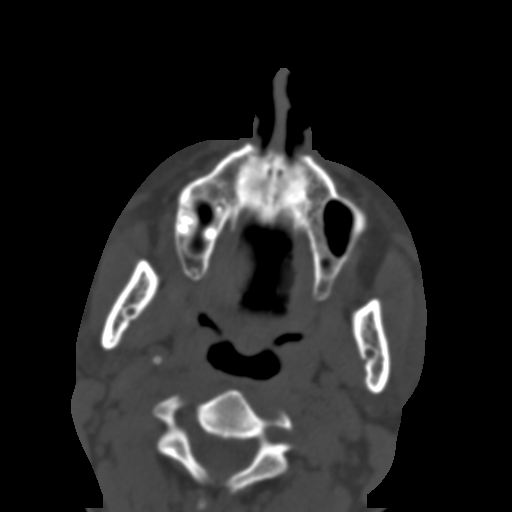
[im 48/77  bone]
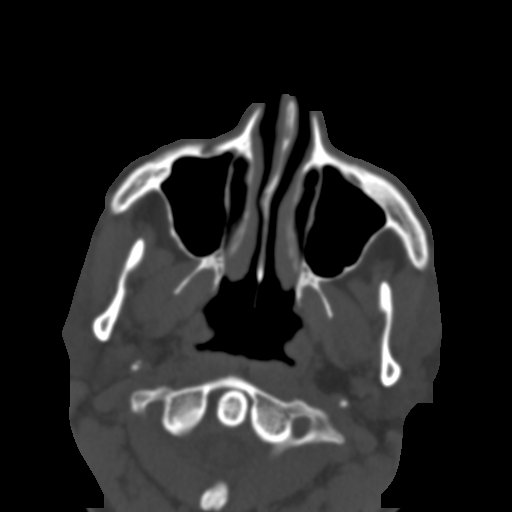
[im 56/77  bone]
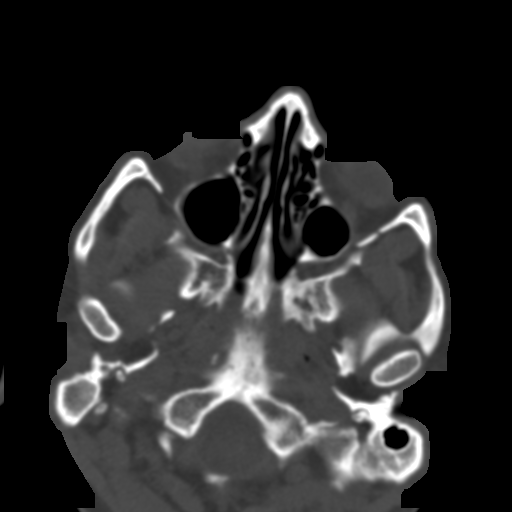
[im 63/77  bone]
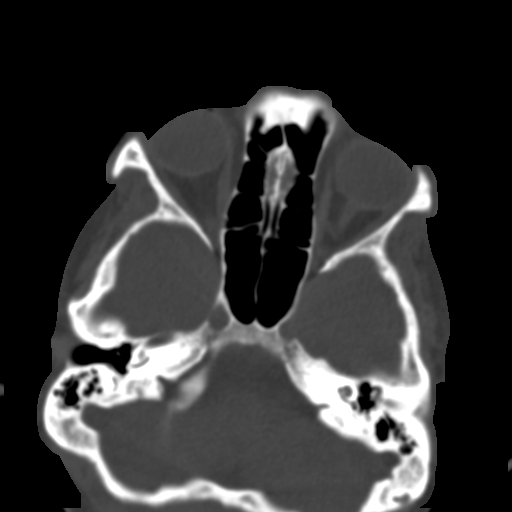
[im 71/77  brain]
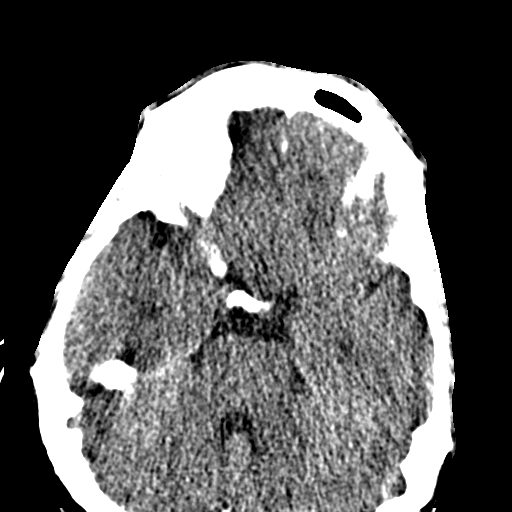
[im 71/77  bone]
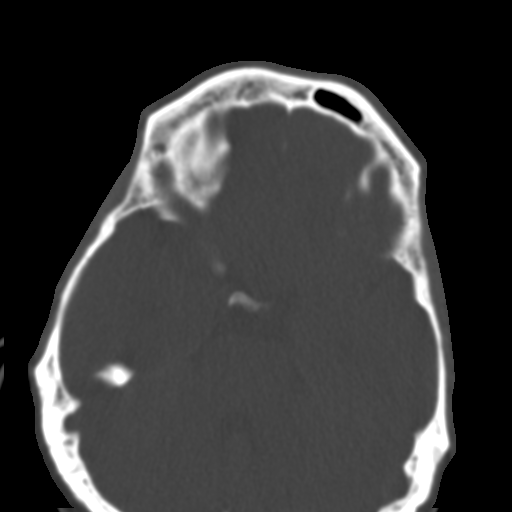

[Series 7: coronal soft · coronal · 0.29mm/px · 3 of 72 slices shown]
[im 24/72  bone]
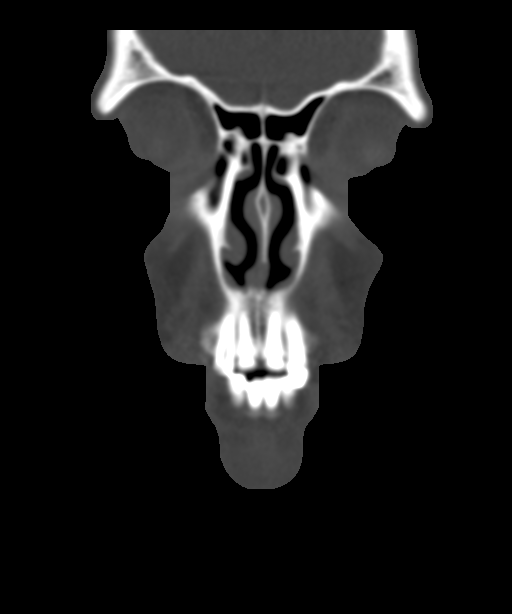
[im 32/72  bone]
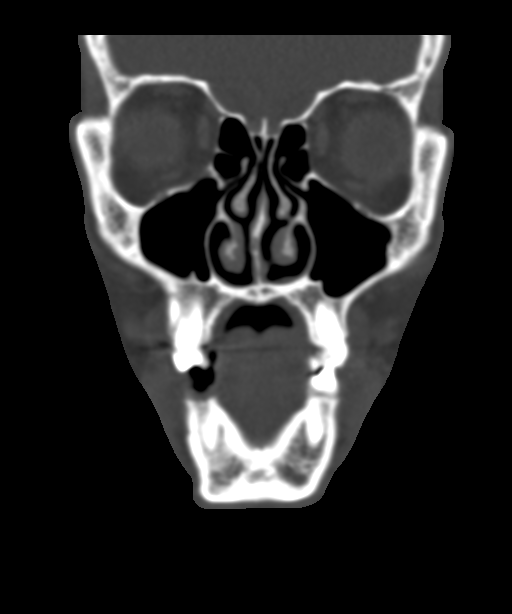
[im 40/72  bone]
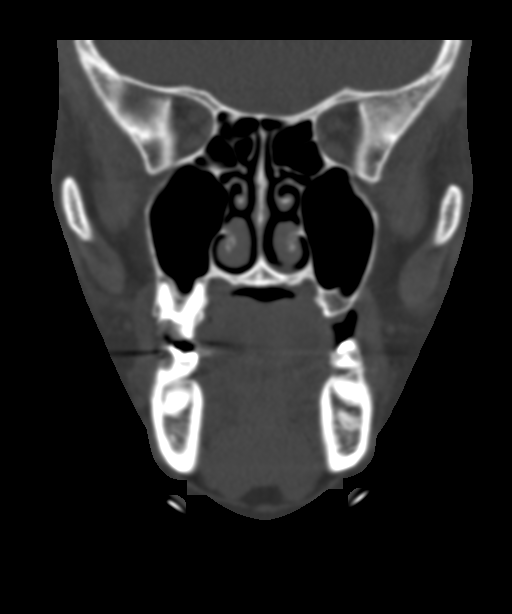

[Series 8: sagittal soft · sagittal · 0.31mm/px · 3 of 79 slices shown]
[im 27/79  bone]
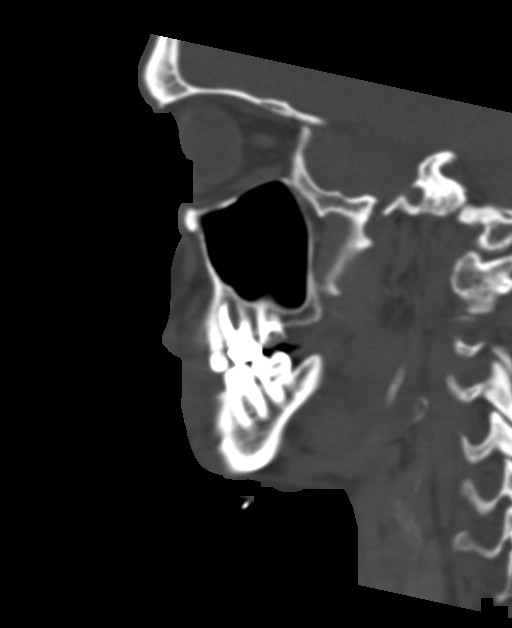
[im 40/79  bone]
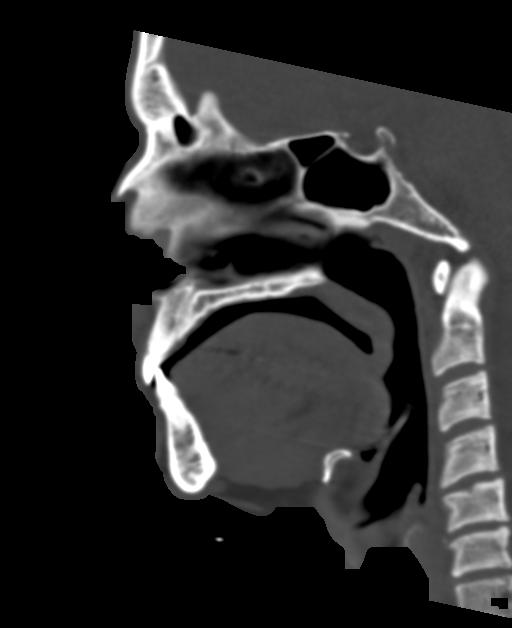
[im 53/79  bone]
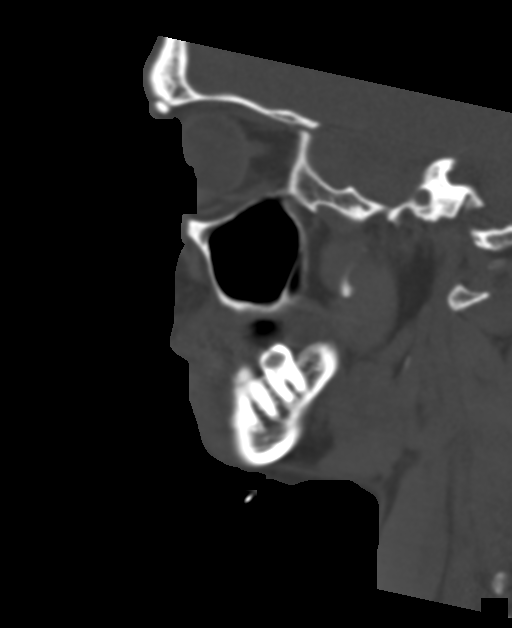

[15 of 47 positions shown; findings below may reference images not displayed]

FINDINGS: Osseous: No acute fracture of the nasal bone, zygomatic arches, or
mandibles. Nasal septum is midline. The temporomandibular joints are
congruent. Multiple dental caries. Occasional absent teeth but no
air pocket to suggest acuity. No fracture of the maxilla. No
fracture of the pterygoid plates.

Orbits: No orbital fracture.  No evidence of globe injury.

Sinuses: No sinus fracture or fluid level. Paranasal sinuses are
clear. No mastoid effusion.

Soft tissues: Minor soft tissue edema.  No soft tissue air.

Limited intracranial: Assessed on concurrent head CT, reported
separately.
IMPRESSION: 1. No facial bone fracture.
2. Multiple dental caries. Occasional absent teeth but no air pocket
to suggest acuity.

## 2021-11-23 ENCOUNTER — Other Ambulatory Visit (HOSPITAL_COMMUNITY): Payer: Self-pay

## 2021-12-03 ENCOUNTER — Other Ambulatory Visit (HOSPITAL_COMMUNITY): Payer: Self-pay

## 2021-12-03 ENCOUNTER — Encounter: Payer: Self-pay | Admitting: Student

## 2021-12-03 ENCOUNTER — Ambulatory Visit: Payer: Self-pay | Admitting: Student

## 2021-12-03 DIAGNOSIS — F1120 Opioid dependence, uncomplicated: Secondary | ICD-10-CM

## 2021-12-03 DIAGNOSIS — F1190 Opioid use, unspecified, uncomplicated: Secondary | ICD-10-CM

## 2021-12-03 MED ORDER — BUPRENORPHINE HCL-NALOXONE HCL 8-2 MG SL SUBL
1.0000 | SUBLINGUAL_TABLET | Freq: Two times a day (BID) | SUBLINGUAL | 0 refills | Status: DC
  Filled 2021-12-03: qty 28, 14d supply, fill #0

## 2021-12-03 NOTE — Patient Instructions (Signed)
Please restart suboxone 1 tablet twice daily and follow up in 1 week  ?

## 2021-12-06 NOTE — Progress Notes (Signed)
   CC: OUD follow up HPI:  [name] is a 31 y.o. person with history of severe opioid use disorder who presents today for OUD follow up.   Past Medical History:  Diagnosis Date   Opioid use disorder    Review of Systems:  Negative as per HPI  Physical Exam:  Vitals:   12/03/21 1556  BP: 127/85  Pulse: (!) 114  Temp: 98.7 F (37.1 C)  TempSrc: Oral  SpO2: 100%  Weight: 132 lb 14.4 oz (60.3 kg)  Height: 5\' 6"  (1.676 m)   Physical Exam Constitutional:      General: He is not in acute distress.    Appearance: Normal appearance.  Cardiovascular:     Rate and Rhythm: Regular rhythm. Tachycardia present.     Heart sounds: No murmur heard.   No friction rub. No gallop.  Pulmonary:     Effort: Pulmonary effort is normal.     Breath sounds: Normal breath sounds. No rhonchi or rales.  Abdominal:     General: Abdomen is flat. Bowel sounds are normal.     Palpations: Abdomen is soft.  Skin:    Findings: No lesion or rash.     Comments: Mild diaphoresis   Neurological:     General: No focal deficit present.     Mental Status: He is alert and oriented to person, place, and time.  Psychiatric:     Comments: Anxious, restless     Assessment & Plan:   See Encounters Tab for problem based charting.  Patient discussed with Dr. 

## 2021-12-06 NOTE — Assessment & Plan Note (Addendum)
Reports relapse in heroin use and is not taking Suboxone regularly.  Denies IV heroin use since last appointment.  Last used heroin about 3 to 4 days ago.  Has not had any Suboxone in the last couple days.  He does report he had does have about 20 tablets left of his prior prescription of Suboxone.  States one of his friends had been staying at his house who also uses heroin.  Plans to have friend move out of his house and would like to resume Suboxone use.  He denies any other drug use. He appears motivated to continue with Suboxone treatment.  States when he was on Suboxone twice daily prior he did occasionally get cravings in between doses.   Has some mild symptoms of withdrawal today including anxiousness, tachycardia, mild diaphorsis.  Will hopefully be in a more supportive environment now that his friend is going to move out.  Suboxone 8-2 mg twice daily ToxAssure at next visit Follow-up in 1 week

## 2021-12-13 ENCOUNTER — Other Ambulatory Visit: Payer: Self-pay

## 2021-12-13 ENCOUNTER — Other Ambulatory Visit (HOSPITAL_COMMUNITY): Payer: Self-pay

## 2021-12-13 ENCOUNTER — Encounter: Payer: Self-pay | Admitting: Internal Medicine

## 2021-12-13 ENCOUNTER — Ambulatory Visit (INDEPENDENT_AMBULATORY_CARE_PROVIDER_SITE_OTHER): Payer: Self-pay | Admitting: Internal Medicine

## 2021-12-13 DIAGNOSIS — F1120 Opioid dependence, uncomplicated: Secondary | ICD-10-CM

## 2021-12-13 DIAGNOSIS — R21 Rash and other nonspecific skin eruption: Secondary | ICD-10-CM

## 2021-12-13 DIAGNOSIS — F1190 Opioid use, unspecified, uncomplicated: Secondary | ICD-10-CM

## 2021-12-13 DIAGNOSIS — R634 Abnormal weight loss: Secondary | ICD-10-CM

## 2021-12-13 MED ORDER — BUPRENORPHINE HCL-NALOXONE HCL 8-2 MG SL SUBL
1.0000 | SUBLINGUAL_TABLET | Freq: Two times a day (BID) | SUBLINGUAL | 1 refills | Status: DC
  Filled 2021-12-13: qty 28, 14d supply, fill #0
  Filled 2022-03-01: qty 28, 14d supply, fill #1

## 2021-12-13 NOTE — Assessment & Plan Note (Signed)
Patient was 134 pounds today.  This is stable since last OV.  Overall downtrending over the last few months, weight 160 pounds November 2022.  Have previously discussed increase p.o. intake 3 snacks and high calorie diet.  Continue to monitor at subsequent OVs

## 2021-12-13 NOTE — Assessment & Plan Note (Signed)
Patient changed detergent a second time and rash has resolved.

## 2021-12-13 NOTE — Progress Notes (Signed)
  CC: OUD clinic visit  HPI:  Jesus Keith is a 31 y.o. male with a past medical history stated below.   Morbilliform rash Patient changed detergent a second time and rash has resolved.  Severe opioid use disorder [facility]) Patient presents for ED clinic follow-up appointment.  His Suboxone was last dispensed 5/05 when he was prescribed 14 days worth of 8-10 mg Suboxone twice daily and 1 refill.  Patient was intermittently off of his Suboxone during a relapse.  He had a coworker living with him at the time he was using heroin and this environment significantly contributed to patient's relapse.  Since his last office visit with [facility] 5/18, patient has been using Suboxone, no further relapses.  His friend and coworker is no longer living with him.  He continues to live with his girlfriend though their relationship was strained by his recent relapse.  He is motivated to stay on track with his Suboxone and repair his relationship with his girlfriend.  He is working with [employer] and finds he is more irritable at work due to cravings though he is still able to complete his responsibilities.  He also finds he is intermittently sweaty, anxious, nauseous, usually between doses.  He mentions sometimes it is difficult for him to afford the $25 Suboxone cost depending on how financially strained he is at the time  Since last OV, patient with opiate use disorder has been adherent to Suboxone. Plan: -Tox assure today -Refilled Suboxone 8-2 mg twice daily, 14-day supply with 1 refill -Follow-up in 1 month  Recent weight loss Patient was 134 pounds today.  This is stable since last OV.  Overall downtrending over the last few months, weight 160 pounds November 2022.  Have previously discussed increase p.o. intake 3 snacks and high calorie diet.  Continue to monitor at subsequent OVs    Past Medical History:  Diagnosis Date   Opioid use disorder     No current outpatient medications on file  prior to visit.   No current facility-administered medications on file prior to visit.    Family History  Problem Relation Age of Onset   Breast cancer Maternal Grandfather     Review of Systems: ROS negative except for what is noted on the assessment and plan.  Vitals:   12/13/21 1552 12/13/21 1641  BP: (!) 144/91 (!) 145/88  Pulse: 74 98  Temp: 98.1 F (36.7 C)   TempSrc: Oral   SpO2: 100%   Weight: 134 lb 1.6 oz (60.8 kg)   Height: 5\' 8"  (1.727 m)      Physical Exam: General: Well appearing low weight Caucasian male, NAD HENT: normocephalic, atraumatic, external ears and nares appear unremarkable EYES: conjunctiva non-erythematous, no scleral icterus CV: regular rate, normal rhythm, no murmurs, rubs, gallops.  Pulmonary: normal work of breathing on RA, lungs clear to auscultation, no rales, wheezes, rhonchi Skin: Warm and dry, no rashes or lesions on exposed surfaces Neurological: MS: awake, alert and oriented x3, normal speech and fund of knowledge Motor: moves all extremities antigravity Psych: normal affect     See Encounters Tab for problem based charting.  Patient discussed with Dr. Lonzo Cloud, M.D. [facility], PGY-1 Pager: [phone] Date 12/13/2021 Time 10:29 PM

## 2021-12-13 NOTE — Patient Instructions (Signed)
Thank you, Mr.Caspar JAHZEEL HOGAN for allowing [place] to provide your care today. Today we discussed:  Opioid Use Disorder: -We will send the ToxAssure test today -We refilled your Suboxone -Follow up in one month  I have ordered the following labs for you:  Lab Orders         ToxAssure Select,+Antidepr,UR       My Chart Access: [email or web address]  Please follow-up in 1 month for OUD clinic visit.  Please make sure to arrive 15 minutes prior to your next appointment. If you arrive late, you may be asked to reschedule.    We look forward to seeing you next time. Please call our clinic at [phone] if you have any questions or concerns. The best time to call is Monday-Friday from 9am-4pm, but there is someone available 24/7. If after hours or the weekend, call the main hospital number and ask for the Internal Medicine Resident On-Call. If you need medication refills, please notify your pharmacy one week in advance and they will send [place] a request.   Thank you for letting us take part in your care. Wishing you the best!  Wayland Denis, MD 12/13/2021, 4:47 PM IM Resident, PGY-1

## 2021-12-13 NOTE — Assessment & Plan Note (Addendum)
Patient presents for ED clinic follow-up appointment.  His Suboxone was last dispensed 5/05 when he was prescribed 14 days worth of 8-10 mg Suboxone twice daily and 1 refill.  Patient was intermittently off of his Suboxone during a relapse.  He had a coworker living with him at the time he was using heroin and this environment significantly contributed to patient's relapse.  Since his last office visit with [facility] 5/18, patient has been using Suboxone, no further relapses.  His friend and coworker is no longer living with him.  He continues to live with his girlfriend though their relationship was strained by his recent relapse.  He is motivated to stay on track with his Suboxone and repair his relationship with his girlfriend.  He is working with [employer] and finds he is more irritable at work due to cravings though he is still able to complete his responsibilities.  He also finds he is intermittently sweaty, anxious, nauseous, usually between doses.  He mentions sometimes it is difficult for him to afford the $25 Suboxone cost depending on how financially strained he is at the time  Since last OV, patient with opiate use disorder has been adherent to Suboxone. Plan: -Tox assure today -Refilled Suboxone 8-2 mg twice daily, 14-day supply with 1 refill -Follow-up in 1 month

## 2021-12-14 NOTE — Progress Notes (Signed)
Internal Medicine Clinic Attending ? ?Case discussed with Dr. Zinoviev  At the time of the visit.  We reviewed the resident?s history and exam and pertinent patient test results.  I agree with the assessment, diagnosis, and plan of care documented in the resident?s note.  ?

## 2021-12-18 NOTE — Progress Notes (Signed)
Internal Medicine Clinic Attending ? ?Case discussed with Dr. Liang  At the time of the visit.  We reviewed the resident?s history and exam and pertinent patient test results.  I agree with the assessment, diagnosis, and plan of care documented in the resident?s note. ? ?

## 2021-12-20 LAB — TOXASSURE SELECT,+ANTIDEPR,UR

## 2022-01-13 ENCOUNTER — Encounter: Payer: Self-pay | Admitting: *Deleted

## 2022-03-01 ENCOUNTER — Other Ambulatory Visit (HOSPITAL_COMMUNITY): Payer: Self-pay

## 2022-03-12 ENCOUNTER — Telehealth: Payer: Self-pay | Admitting: *Deleted

## 2022-03-12 ENCOUNTER — Other Ambulatory Visit (HOSPITAL_COMMUNITY): Payer: Self-pay

## 2022-03-12 NOTE — Telephone Encounter (Signed)
Call from [facility] in [place]-patient received prescription for Temazepam 7.5 mg tablets #7. Take 1 at bedtime.  Pharmacy needas to know if this can be filled since patient is on Suboxone.  Need ok from doctor.   Can call [facility] at [phone].

## 2022-03-12 NOTE — Telephone Encounter (Signed)
I spoke with the pharmacist and told her not to fill the Temazepam. That medication is not prescribed by [place] and not even on his chart. If patient needs something to help him sleep, he can come in to be evaluated for insomnia. Thanks.

## 2022-04-05 ENCOUNTER — Other Ambulatory Visit: Payer: Self-pay | Admitting: Student in an Organized Health Care Education/Training Program

## 2022-04-05 ENCOUNTER — Other Ambulatory Visit (HOSPITAL_COMMUNITY): Payer: Self-pay

## 2022-04-05 DIAGNOSIS — F1120 Opioid dependence, uncomplicated: Secondary | ICD-10-CM

## 2022-04-05 DIAGNOSIS — F1190 Opioid use, unspecified, uncomplicated: Secondary | ICD-10-CM

## 2022-04-05 NOTE — Telephone Encounter (Signed)
Patient is due for an appointment and needs to be evaluated prior to refill. Last prescription was 05/25 for 14 days with one refill and it looks like the refill was filled mid August.

## 2022-04-05 NOTE — Telephone Encounter (Signed)
Last ToxAssure and appointment was 12/13/2021.  No future appointments.

## 2022-04-24 ENCOUNTER — Ambulatory Visit (INDEPENDENT_AMBULATORY_CARE_PROVIDER_SITE_OTHER): Payer: Self-pay | Admitting: Internal Medicine

## 2022-04-24 ENCOUNTER — Other Ambulatory Visit: Payer: Self-pay

## 2022-04-24 ENCOUNTER — Other Ambulatory Visit (HOSPITAL_COMMUNITY): Payer: Self-pay

## 2022-04-24 ENCOUNTER — Encounter: Payer: Self-pay | Admitting: Internal Medicine

## 2022-04-24 VITALS — BP 114/65 | HR 79 | Temp 98.1°F | Resp 28 | Ht 68.0 in | Wt 133.9 lb

## 2022-04-24 DIAGNOSIS — F1190 Opioid use, unspecified, uncomplicated: Secondary | ICD-10-CM

## 2022-04-24 DIAGNOSIS — F1120 Opioid dependence, uncomplicated: Secondary | ICD-10-CM

## 2022-04-24 MED ORDER — BUPRENORPHINE HCL-NALOXONE HCL 8-2 MG SL SUBL
1.0000 | SUBLINGUAL_TABLET | Freq: Two times a day (BID) | SUBLINGUAL | 0 refills | Status: DC
  Filled 2022-04-24: qty 28, 14d supply, fill #0

## 2022-04-24 NOTE — Patient Instructions (Signed)
Mr. Feild,  It was a pleasure to care for you today. I have sent in a refill for your Suboxone.   I would like to see back in 2 weeks to make sure you are doing okay.  My best, Dr. Marlou Sa

## 2022-04-24 NOTE — Progress Notes (Deleted)
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

## 2022-04-24 NOTE — Assessment & Plan Note (Signed)
Patient presents today for refill on Suboxone. His last fill was in August for a 14 day supply. Prior to that, his last fill was in May. He states that he did have a relapse over the summer and at that time he was not taking suboxone and as a result has had a supply left over, which is how he is still able to take doses. His last ToxAssure in May was positive for methamphetamine and amphetamine, and negative for buprenorphine.   He currently takes one dose twice daily, sometimes 1.5. He tells me that he is motivated to stay clean and that his new partner and her children are good motivation. He says that his relapses have occurred when things felt "too normal," but that it doesn't take long for him to realize that he doesn't want to use anymore. His longest stretch of abstinence was about 1.5 years.  He denies cravings or diversion at this time. No other substance use at this time. He does not go to regular narcotics anonymous meetings. His weight is stable and he has started drinking Breakfast Essentials drinks in the mornings. He notes that his trouble with gaining weight seems to be associated with when he started Suboxone. No fevers or chills. No withdrawal symptoms. Assessment:Given inconsistency with Suboxone fills and recent relapse I worry about compliance with Suboxone. I do believe he would benefit with frequent follow-up, every 2 weeks, at least initially prior to extending to 1 month follow ups. Plan:14 day supply of Suboxone 8-2 mg BID sent in. Will have Mr. Larios follow up in clinic in 2 weeks to make sure he is doing well and can send in another prescription at that time. ToxAssure obtained today.

## 2022-04-24 NOTE — Progress Notes (Signed)
   CC: medication refill  HPI:  Jesus Keith is a 31 y.o. person with past medical history as detailed below who presents today for medication refill of Suboxone. Please see problem based charting for detailed assessment and plan.  Past Medical History:  Diagnosis Date   Opioid use disorder    Review of Systems:  Negative unless otherwise stated.  Physical Exam:  Vitals:   04/24/22 1018  BP: 114/65  Pulse: 79  Resp: (!) 28  Temp: 98.1 F (36.7 C)  TempSrc: Oral  SpO2: 100%  Weight: 133 lb 14.4 oz (60.7 kg)  Height: 5\' 8"  (1.727 m)   Constitutional:Appears stated age, looks well. In no acute distress. Cardio:Regular rate and rhythm. No murmurs, rubs, or gallops. Pulm:Clear to auscultation bilaterally. Normal work of breathing on room air. EUM:PNTIRWER for extremity edema. Skin:Warm and dry. Neuro:Alert and oriented x3. No focal deficit noted. Psych:Pleasant mood and affect.   Assessment & Plan:   See Encounters Tab for problem based charting.  Severe opioid use disorder [facility]) Patient presents today for refill on Suboxone. His last fill was in August for a 14 day supply. Prior to that, his last fill was in May. He states that he did have a relapse over the summer and at that time he was not taking suboxone and as a result has had a supply left over, which is how he is still able to take doses. His last ToxAssure in May was positive for methamphetamine and amphetamine, and negative for buprenorphine.   He currently takes one dose twice daily, sometimes 1.5. He tells me that he is motivated to stay clean and that his new partner and her children are good motivation. He says that his relapses have occurred when things felt "too normal," but that it doesn't take long for him to realize that he doesn't want to use anymore. His longest stretch of abstinence was about 1.5 years.  He denies cravings or diversion at this time. No other substance use at this time. He does not go  to regular narcotics anonymous meetings. His weight is stable and he has started drinking Breakfast Essentials drinks in the mornings. He notes that his trouble with gaining weight seems to be associated with when he started Suboxone. No fevers or chills. No withdrawal symptoms. Assessment:Given inconsistency with Suboxone fills and recent relapse I worry about compliance with Suboxone. I do believe he would benefit with frequent follow-up, every 2 weeks, at least initially prior to extending to 1 month follow ups. Plan:14 day supply of Suboxone 8-2 mg BID sent in. Will have Mr. Reetz follow up in clinic in 2 weeks to make sure he is doing well and can send in another prescription at that time. ToxAssure obtained today.  Patient discussed with Dr.  Saverio Danker

## 2022-04-26 LAB — TOXASSURE SELECT,+ANTIDEPR,UR

## 2022-05-01 NOTE — Progress Notes (Signed)
Internal Medicine Clinic Attending  Case discussed with Dr. Marlou Sa  At the time of the visit.  We reviewed the resident's history and exam and pertinent patient test results.  I agree with the assessment, diagnosis, and plan of care documented in the resident's note. Jesus Keith has had inconsistent follow-up for opioid use disorder, however he does present today sounding motivated to maintain abstinence and engage in care. ToxAssure positive for methamphetamine which he reported to Dr. Marlou Sa that he has quit using. Will schedule close f/u and repeat urine testing at next appointment.

## 2022-05-08 ENCOUNTER — Encounter: Payer: Self-pay | Admitting: Student

## 2022-05-08 ENCOUNTER — Encounter: Payer: Self-pay | Admitting: Internal Medicine

## 2022-05-24 ENCOUNTER — Other Ambulatory Visit: Payer: Self-pay

## 2022-05-24 ENCOUNTER — Encounter: Payer: Self-pay | Admitting: Student

## 2022-05-24 ENCOUNTER — Other Ambulatory Visit (HOSPITAL_COMMUNITY): Payer: Self-pay

## 2022-05-24 ENCOUNTER — Ambulatory Visit (INDEPENDENT_AMBULATORY_CARE_PROVIDER_SITE_OTHER): Payer: Self-pay | Admitting: Student

## 2022-05-24 VITALS — BP 123/72 | HR 71 | Temp 98.3°F | Ht 66.0 in | Wt 145.9 lb

## 2022-05-24 DIAGNOSIS — F1120 Opioid dependence, uncomplicated: Secondary | ICD-10-CM

## 2022-05-24 DIAGNOSIS — F1190 Opioid use, unspecified, uncomplicated: Secondary | ICD-10-CM

## 2022-05-24 DIAGNOSIS — K5903 Drug induced constipation: Secondary | ICD-10-CM

## 2022-05-24 MED ORDER — SENNA 8.6 MG PO TABS
1.0000 | ORAL_TABLET | Freq: Every day | ORAL | 0 refills | Status: AC
  Filled 2022-05-24: qty 90, 90d supply, fill #0

## 2022-05-24 MED ORDER — BUPRENORPHINE HCL-NALOXONE HCL 8-2 MG SL SUBL
1.0000 | SUBLINGUAL_TABLET | Freq: Two times a day (BID) | SUBLINGUAL | 0 refills | Status: DC
  Filled 2022-05-24: qty 5, 3d supply, fill #0
  Filled 2022-05-24: qty 23, 11d supply, fill #0

## 2022-05-24 NOTE — Progress Notes (Signed)
Subjective:   Patient ID: Jesus Keith male   DOB: [birth year] 31 y.o.   MRN: [id number]  HPI: [name] is a 31 y.o. man who presents to clinic for refill of Suboxone.  Patient was given 14 day supply of Suboxone on 04/24/22. He was scheduled to have a 2 week follow-up appointment, but reports he was not able to make the appointment due to transportation limitations. He ran out of the prescribed Suboxone 2 weeks ago - he obtained Suboxone from outside sources since running out of his prescription. He reports when on the Suboxone, he occasionally gets headaches and experiences occasionally cravings, but has not relapsed to recreational opioid use for many months. He denies dizziness. He reports being constipated with having a bowel movement about every 4-5 days. He reports having some difficulty initiating urination. He reports that overall, the Suboxone therapy has been helping him feel better throughout the day.  Review of Systems: Pertinent items are noted in HPI.  Past Medical History:  Diagnosis Date   Opioid use disorder     Patient Active Problem List   Diagnosis Date Noted   Severe opioid use disorder ([place]) 05/17/2021     Current Outpatient Medications  Medication Sig Dispense Refill   senna (SENOKOT) 8.6 MG TABS tablet Take 1 tablet (8.6 mg total) by mouth daily. 90 tablet 0   buprenorphine-naloxone (SUBOXONE) 8-2 mg SUBL SL tablet Place 1 tablet under the tongue in the morning and at bedtime for 14 days. 28 tablet 0   No current facility-administered medications for this visit.    Objective:   Physical Exam: There were no vitals filed for this visit.  Constitutional: Pleasant, well appearing, in no acute distress HENT: mucous membranes moist Cardiovascular: regular rate with normal rhythm, no murmurs Pulmonary/Chest: normal work of breathing on room air, lungs clear to auscultation bilaterally Abdominal: soft, non-tender, non-distended, bowel sounds  present Skin: warm and dry. Neurological: alert and answering questions appropriately. Psych: appropriate mood and affect   Assessment & Plan:   Severe opioid use disorder ([place]) Patient was given 14 day supply of Suboxone on 04/24/22. He was scheduled to have a 2 week follow-up appointment, but reports he was not able to make the appointment due to transportation limitations as he shares a vehicle with his partner. He ran out of the prescribed Suboxone 2 weeks ago - he obtained Suboxone from outside sources since running out of his prescription. He reports when on the Suboxone, he occasionally gets headaches and experiences occasionally cravings, but has not relapsed to recreational opioid use for many months. He reports constipation and has a bowel movement about every 4-5 days. He reports having some difficulty initiating urination. His ToxAssure from 04/24/22 showed positive methamphetamine and amphetamine, which patient reports taking recreationally a day before the 10/4 appointment, but reports he has not had any recreational drugs since that time. Overall, the Suboxone therapy appears to be helping him feel better throughout the day, and he is making effort to stay adherent and compliant with the Suboxone therapy.  Plan -Prescribe 14-day supply of Suboxone 8-2 mg tablet BID -In-person follow-up in two weeks. Patient was counseled and strongly encouraged to attend this in-person appointment. If he is unable to attend this appointment, he was instructed to call the clinic to schedule telehealth visit to see how he is doing with the therapy.  -Follow-up ToxAssure obtained today -Start Senna 8.6 mg tablet daily for constipation  Patient discussed  with Dr. Saverio Danker.  Jesus Keith (Max) [place], MS3 05/24/2022

## 2022-05-24 NOTE — Patient Instructions (Addendum)
[name],  It was great to see you in clinic today.  Today, we discussed refilling your Suboxone:  -We will be refilling your Suboxone with a 14-day supply and will follow-up with you in-person in two weeks to see how you are doing with the medication. We strongly encourage you to make this appointment, as then we can discuss a longer prescription duration. If you are not able to make the appointment please give the clinic a call and we will try to schedule you for a telehealth visit. -For your constipation, we will be giving you Senna  Please note the following changes to your medications: Start Senna 1 tablet daily for constipation  It is a pleasure being a part of your team, thank you for allowing [place] to be a part of your care,  Max and Dr. Collene Gobble  Please call our clinic if you have any questions or concerns, we may be able to help and keep you from a long and expensive emergency room wait. Our clinic and after hours phone number is [phone], the best time to call is Monday through Friday 9 am to 4 pm but there is always someone available 24/7 if you have an emergency. If you need medication refills please notify your pharmacy one week in advance and they will send [place] a request.

## 2022-05-24 NOTE — Assessment & Plan Note (Addendum)
Patient was given 14 day supply of Suboxone on 04/24/22. He was scheduled to have a 2 week follow-up appointment, but reports he was not able to make the appointment due to transportation limitations as he shares a vehicle with his partner. He ran out of the prescribed Suboxone 2 weeks ago - he obtained Suboxone from outside sources since running out of his prescription. He reports when on the Suboxone, he occasionally gets headaches and experiences occasionally cravings, but has not relapsed to recreational opioid use for many months. He reports constipation and has a bowel movement about every 4-5 days. He reports having some difficulty initiating urination. His ToxAssure from 04/24/22 showed positive methamphetamine and amphetamine, which patient reports taking recreationally a day before the 10/4 appointment, but reports he has not had any recreational drugs since that time. Overall, the Suboxone therapy appears to be helping him feel better throughout the day, and he is making effort to stay adherent and compliant with the Suboxone therapy.  Plan -Prescribe 14-day supply of Suboxone 8-2 mg tablet BID -In-person follow-up in two weeks. Patient was counseled and strongly encouraged to attend this in-person appointment. If he is unable to attend this appointment, he was instructed to call the clinic to schedule telehealth visit to see how he is doing with the therapy.  -Follow-up ToxAssure obtained today -Start Senna 8.6 mg tablet daily for constipation

## 2022-05-29 LAB — TOXASSURE SELECT,+ANTIDEPR,UR

## 2022-05-30 NOTE — Progress Notes (Signed)
Internal Medicine Clinic Attending ? ?Case discussed with Dr. Braswell  At the time of the visit.  We reviewed the resident?s history and exam and pertinent patient test results.  I agree with the assessment, diagnosis, and plan of care documented in the resident?s note.  ?

## 2022-06-07 ENCOUNTER — Other Ambulatory Visit (HOSPITAL_COMMUNITY): Payer: Self-pay

## 2022-06-07 ENCOUNTER — Ambulatory Visit (INDEPENDENT_AMBULATORY_CARE_PROVIDER_SITE_OTHER): Payer: Self-pay | Admitting: Student

## 2022-06-07 DIAGNOSIS — F1120 Opioid dependence, uncomplicated: Secondary | ICD-10-CM

## 2022-06-07 MED ORDER — BUPRENORPHINE HCL-NALOXONE HCL 8-2 MG SL SUBL
SUBLINGUAL_TABLET | SUBLINGUAL | 0 refills | Status: DC
  Filled 2022-06-07: qty 35, 14d supply, fill #0

## 2022-06-07 NOTE — Patient Instructions (Signed)
Mr.Flay Zipporah Plants, it was a pleasure seeing you today!  Today we discussed: - I have increased your dose to add an additional half tablet in the afternoon. Come back in two weeks and we will see how you are doing with this dose.   I have ordered the following medication/changed the following medications:   Start the following medications: Meds ordered this encounter  Medications   buprenorphine-naloxone (SUBOXONE) 8-2 mg SUBL SL tablet    Sig: Take 1 tablet in the morning, 1/2 tablet in the afternoon, and 1 tablet in the evening.    Dispense:  35 tablet    Refill:  0    IM program     Follow-up:  2 weeks    Please make sure to arrive 15 minutes prior to your next appointment. If you arrive late, you may be asked to reschedule.   We look forward to seeing you next time. Please call our clinic at [phone] if you have any questions or concerns. The best time to call is Monday-Friday from 9am-4pm, but there is someone available 24/7. If after hours or the weekend, call the main hospital number and ask for the Internal Medicine Resident On-Call. If you need medication refills, please notify your pharmacy one week in advance and they will send [place] a request.  Thank you for letting us take part in your care. Wishing you the best!  Thank you, Evlyn Kanner, MD

## 2022-06-09 NOTE — Assessment & Plan Note (Signed)
[name] is presenting today for OUD follow-up. Since his last visit he reports compliance with suboxone. He was prescribed two tablets daily, but has been breaking these into smaller doses and taking them more frequently throughout the day. He does report feeling increased cravings in the middle of his work day, around 2pm. He denies any drug use since his last visit with [place] two weeks ago. He also mentions his bowel movements have increased since initiating senna.   Plan today to increase dosage to 2.5 tablets daily. Encouraged him to take the extra half tablet around 1-2pm in the afternoon to help control cravings. We will have him follow-up in two weeks to see how he is doing on this dose.  - Increase dosage suboxone 8-2mg  2.5 tablets daily - Continue daily senna - Follow-up in two weeks

## 2022-06-09 NOTE — Progress Notes (Signed)
   06/09/2022  Jesus Keith presents for follow up of opioid use disorder I have reviewed the prior induction visit, follow up visits, and telephone encounters relevant to opiate use disorder (OUD) treatment.   Current daily dose: 8-2mg  twice daily, splits the two tablets into smaller doses throughout the day  Date of Induction: 05/17/2021  Current follow up interval, in weeks: 2 weeks  The patient has been adherent with the buprenorphine for OUD contract.   Last UDS Result: appropriate  Exam:   Vitals:   06/07/22 0936  BP: 122/68  Pulse: 78  SpO2: 98%  Weight: 138 lb 4.8 oz (62.7 kg)   General: Resting comfortably in no acute distress CV: Regular rate, rhythm. No murmurs appreciated. Warm extremities.  Pulm: Normal work of breathing on room air. Clear to auscultation bilaterally. Psych: Normal mood, affect, speech.  Assessment/Plan:  Severe opioid use disorder [facility]) Jesus Keith is presenting today for OUD follow-up. Since his last visit he reports compliance with suboxone. He was prescribed two tablets daily, but has been breaking these into smaller doses and taking them more frequently throughout the day. He does report feeling increased cravings in the middle of his work day, around 2pm. He denies any drug use since his last visit with [place] two weeks ago. He also mentions his bowel movements have increased since initiating senna.   Plan today to increase dosage to 2.5 tablets daily. Encouraged him to take the extra half tablet around 1-2pm in the afternoon to help control cravings. We will have him follow-up in two weeks to see how he is doing on this dose.  - Increase dosage suboxone 8-2mg  2.5 tablets daily - Continue daily senna - Follow-up in two weeks   Evlyn Kanner, MD  06/09/2022  9:45 PM

## 2022-06-10 NOTE — Progress Notes (Signed)
Internal Medicine Clinic Attending ? ?Case discussed with Dr. Braswell  At the time of the visit.  We reviewed the resident?s history and exam and pertinent patient test results.  I agree with the assessment, diagnosis, and plan of care documented in the resident?s note.  ?

## 2022-06-27 ENCOUNTER — Ambulatory Visit (INDEPENDENT_AMBULATORY_CARE_PROVIDER_SITE_OTHER): Payer: Self-pay | Admitting: Student

## 2022-06-27 DIAGNOSIS — F1120 Opioid dependence, uncomplicated: Secondary | ICD-10-CM

## 2022-06-27 MED ORDER — BUPRENORPHINE HCL-NALOXONE HCL 8-2 MG SL SUBL: SUBLINGUAL_TABLET | SUBLINGUAL | 1 refills | Status: DC

## 2022-06-27 NOTE — Assessment & Plan Note (Addendum)
Patient presents for an IUD follow-up after increasing Suboxone dose from 2 tablets to 2.5 tablets a day.  He was having increased cravings in the middle of the day.  He is now taking an extra half tablet just before then.  He states that since increasing the dose he has had controlled cravings and has not had any increased constipation.  He continues on senna with having bowel movements every other day.  Denies any other issues.  Last tox assure on 05/25/2022 without abnormalities. - Continue Suboxone 8-2 mg 2.5 tablets daily, 14 days with 1 refill sent in to start on 07/01/2022 - Continue senna - Follow-up in 1 month

## 2022-06-27 NOTE — Progress Notes (Signed)
  [facility] Internal Medicine Residency Telephone Encounter Continuity Care Appointment  HPI:  This telephone encounter was created for [name] on 06/27/2022 for the following purpose/cc follow up for OUD.   Past Medical History:  Past Medical History:  Diagnosis Date   Opioid use disorder      ROS:  Mild constipation   Assessment / Plan / Recommendations:  Please see A&P under problem oriented charting for assessment of the patient's acute and chronic medical conditions.  As always, pt is advised that if symptoms worsen or new symptoms arise, they should go to an urgent care facility or to to ER for further evaluation.   Consent and Medical Decision Making:  Patient discussed with Dr. Mayford Knife This is a telephone encounter between Jesus Keith and Rocky Morel on 06/27/2022 for a follow up for OUD on Suboxone. The visit was conducted with the patient located at home and Rocky Morel at [facility]. The patient's identity was confirmed using their DOB and current address. The patient has consented to being evaluated through a telephone encounter and understands the associated risks (an examination cannot be done and the patient may need to come in for an appointment) / benefits (allows the patient to remain at home, decreasing exposure to coronavirus). I personally spent 15 minutes on medical discussion.

## 2022-07-02 NOTE — Progress Notes (Signed)
Internal Medicine Clinic Attending  Case discussed with Dr. Goodwin  At the time of the visit.  We reviewed the resident's history and exam and pertinent patient test results.  I agree with the assessment, diagnosis, and plan of care documented in the resident's note.  

## 2022-07-24 ENCOUNTER — Other Ambulatory Visit (HOSPITAL_COMMUNITY): Payer: Self-pay

## 2022-07-24 ENCOUNTER — Other Ambulatory Visit: Payer: Self-pay

## 2022-07-24 ENCOUNTER — Other Ambulatory Visit: Payer: Self-pay | Admitting: Student

## 2022-07-24 DIAGNOSIS — F1120 Opioid dependence, uncomplicated: Secondary | ICD-10-CM

## 2022-07-24 MED ORDER — BUPRENORPHINE HCL-NALOXONE HCL 8-2 MG SL SUBL
SUBLINGUAL_TABLET | SUBLINGUAL | 0 refills | Status: DC
  Filled 2022-07-24: qty 18, 7d supply, fill #0

## 2022-07-24 NOTE — Telephone Encounter (Signed)
#  35 with 1 refill sent to [facility] on 12/11. Patient states he never picked these up 2/2 cost. He has been out of suboxone for 2.5-3 weeks. He is requesting refill at [facility]. States he is in [place] now for next 30-60 minutes. States he rarely comes to [facility] as it is 40 minutes from his home. Confirmed he will keep his appt with [place] on 07/29/21. Will cancel Rx at [facility].

## 2022-07-24 NOTE — Telephone Encounter (Signed)
Pt states suboxone is not at the pharmacy. Pt is using  pharmacy.

## 2022-07-24 NOTE — Telephone Encounter (Signed)
Confirmed with Shane at [facility] that patient did not p/u Rx for suboxone or refill. Both have now been cancelled. Patient notified that one week supply has been sent to [facility].

## 2022-07-29 ENCOUNTER — Encounter: Payer: Self-pay | Admitting: Student

## 2022-08-07 ENCOUNTER — Ambulatory Visit (INDEPENDENT_AMBULATORY_CARE_PROVIDER_SITE_OTHER): Payer: Self-pay | Admitting: Internal Medicine

## 2022-08-07 ENCOUNTER — Encounter: Payer: Self-pay | Admitting: Internal Medicine

## 2022-08-07 ENCOUNTER — Other Ambulatory Visit (HOSPITAL_COMMUNITY): Payer: Self-pay

## 2022-08-07 DIAGNOSIS — F1120 Opioid dependence, uncomplicated: Secondary | ICD-10-CM

## 2022-08-07 MED ORDER — BUPRENORPHINE HCL-NALOXONE HCL 8-2 MG SL SUBL
SUBLINGUAL_TABLET | SUBLINGUAL | 1 refills | Status: AC
  Filled 2022-08-07: qty 21, 7d supply, fill #0
  Filled 2022-08-23: qty 21, 7d supply, fill #1

## 2022-08-07 NOTE — Progress Notes (Signed)
   CC: OUD  HPI:[name] is a 32 y.o. male who presents for evaluation of OUD. Please see individual problem based A/P for details.  Depression, PHQ-9: Based on the patients  [place] Visit from 09/27/2021 in [place]  PHQ-9 Total Score 0      score we have .  Past Medical History:  Diagnosis Date   Opioid use disorder    Review of Systems:   See HPI  Physical Exam: Vitals:   08/07/22 1358  BP: 122/68  Pulse: 78  Temp: 98.2 F (36.8 C)  TempSrc: Oral  SpO2: 98%  Weight: 144 lb 11.2 oz (65.6 kg)   General: NAD HEENT: Conjunctiva nl , antiicteric sclerae, moist mucous membranes, no exudate or erythema Cardiovascular: Normal rate, regular rhythm.  No murmurs, rubs, or gallops Pulmonary : Equal breath sounds, No wheezes, rales, or rhonchi Abdominal: soft, nontender,  bowel sounds present Ext: No edema in lower extremities, no tenderness to palpation of lower extremities.   Assessment & Plan:   See Encounters Tab for problem based charting.  Patient discussed with Dr. Jimmye Norman

## 2022-08-07 NOTE — Patient Instructions (Signed)
Dear [name],  Thank you for trusting [place] with your care. We discussed your suboxone today.   We will increase your suboxone to 1 tablet three times daily. We would like to see you back in the clinic in 1 month for a follow up. Please be prepared to provide a urine sample at that time.

## 2022-08-07 NOTE — Assessment & Plan Note (Signed)
Patient presenting for OUD follow up. His suboxone was increased from 2 to 2.5 tablets per day to include coverage for midday cravings. He reports this has helped for the most part, but he is still having cravings at this time and will notice changes in his mood at this time of day. For this reason, he is requesting that his suboxone be increased to 3 tablets per day. Patient reports that he recently used the restroom and is not able to provide urine sample at this time. The constipation that he was dealing with at last visit has resolved.   PDMP reviewed and no additional prescribers noted. He is due for medication refill. Last UDS 11/3 without issue, though prior UDS findings for methamphetamine and fentanyl noted. We will increase his dose to 3 tablets at this visit I have sent in for 21 tablets 7 day course with 1 refill for total 14 day supply. Patient can call for refill in 2 weeks. Discussed with patient that he will be required to provide urine sample at next visit in 1 month which he acknowledges. Will plan for follow up in 1 month.

## 2022-08-20 NOTE — Progress Notes (Signed)
Internal Medicine Clinic Attending ° °Case discussed with Dr. Gawaluck  At the time of the visit.  We reviewed the resident’s history and exam and pertinent patient test results.  I agree with the assessment, diagnosis, and plan of care documented in the resident’s note.  °

## 2022-08-23 ENCOUNTER — Other Ambulatory Visit (HOSPITAL_COMMUNITY): Payer: Self-pay

## 2022-09-06 ENCOUNTER — Ambulatory Visit (INDEPENDENT_AMBULATORY_CARE_PROVIDER_SITE_OTHER): Payer: Self-pay | Admitting: Student

## 2022-09-06 ENCOUNTER — Other Ambulatory Visit (HOSPITAL_COMMUNITY): Payer: Self-pay

## 2022-09-06 VITALS — BP 126/72 | HR 75 | Wt 139.4 lb

## 2022-09-06 DIAGNOSIS — F1120 Opioid dependence, uncomplicated: Secondary | ICD-10-CM

## 2022-09-06 MED ORDER — BUPRENORPHINE HCL-NALOXONE HCL 8-2 MG SL SUBL
3.0000 | SUBLINGUAL_TABLET | Freq: Every day | SUBLINGUAL | 1 refills | Status: DC
  Filled 2022-09-06: qty 42, 14d supply, fill #0
  Filled 2022-09-30: qty 42, 14d supply, fill #1

## 2022-09-06 NOTE — Patient Instructions (Signed)
Thank you so much for coming to the clinic today!   Glad to hear you're doing well with the suboxone, I have sent another prescription to the pharmacy with one refill. Take Care!    If you have any questions please feel free to the call the clinic at anytime at [phone]. It was a pleasure seeing you!  Best, Dr. Sanjuana Mae

## 2022-09-06 NOTE — Progress Notes (Addendum)
   CC: Opioid use disorder follow-up  HPI:  [name] is a 32 y.o. male living with a history stated below and presents today for opioid use disorder follow-up. Please see problem based assessment and plan for additional details.  Past Medical History:  Diagnosis Date   Opioid use disorder     Current Outpatient Medications on File Prior to Visit  Medication Sig Dispense Refill   senna (SENOKOT) 8.6 MG TABS tablet Take 1 tablet (8.6 mg total) by mouth daily. 90 tablet 0   No current facility-administered medications on file prior to visit.    Family History  Problem Relation Age of Onset   Breast cancer Maternal Grandfather     Social History   Socioeconomic History   Marital status: Single    Spouse name: Not on file   Number of children: Not on file   Years of education: Not on file   Highest education level: Not on file  Occupational History   Occupation: Heavy Glass blower/designer  Tobacco Use   Smoking status: Never   Smokeless tobacco: Current   Tobacco comments:    Vapes  Vaping Use   Vaping Use: Some days  Substance and Sexual Activity   Alcohol use: Not Currently   Drug use: Not Currently    Types: Heroin   Sexual activity: Not on file  Other Topics Concern   Not on file  Social History Narrative   Not on file   Social Determinants of Health   Financial Resource Strain: Not on file  Food Insecurity: Not on file  Transportation Needs: Not on file  Physical Activity: Not on file  Stress: Not on file  Social Connections: Not on file  Intimate Partner Violence: Not on file    Review of Systems: ROS negative except for what is noted on the assessment and plan.  Vitals:   09/06/22 0947  BP: 126/72  Pulse: 75  Weight: 139 lb 6.4 oz (63.2 kg)    Physical Exam: Constitutional: well-appearing male sitting in chair, in no acute distress, no signs of withdrawal HENT: normocephalic atraumatic, mucous membranes moist, normal dentition Eyes:  conjunctiva non-erythematous Neck: supple Cardiovascular: regular rate and rhythm, no m/r/g Pulmonary/Chest: normal work of breathing on room air, lungs clear to auscultation bilaterally   Assessment & Plan:   Severe opioid use disorder [facility]) Patient presents for an opioid use disorder follow-up.  He has been taking Suboxone regularly, and endorses good compliance.  He denies any use of opioids since starting Suboxone, and has not had any symptoms of withdrawal.  He feels like he is benefiting from the Suboxone, and is adequately keeping him away from using other substances.  His complaint right now is that he has to drive over an hour to come here to get his medications as he does not have insurance.  He is currently working on getting his insurance through his job.  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   Patient discussed with Dr. Tobi Bastos Carrington Keith, M.D. [facility], PGY-1 Phone: [phone] Date 09/17/2022 Time 2:00 PM

## 2022-09-06 NOTE — Assessment & Plan Note (Addendum)
Patient presents for an opioid use disorder follow-up.  He has been taking Suboxone regularly, and endorses good compliance.  He denies any use of opioids since starting Suboxone, and has not had any symptoms of withdrawal.  He feels like he is benefiting from the Suboxone, and is adequately keeping him away from using other substances.  His complaint right now is that he has to drive over an hour to come here to get his medications as he does not have insurance.  He is currently working on getting his insurance through his job.  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

## 2022-09-06 NOTE — Progress Notes (Signed)
Internal Medicine Clinic Attending  Case discussed with Dr. Sanjuana Mae  At the time of the visit.  We reviewed the resident's history and exam and pertinent patient test results.  I agree with the assessment, diagnosis, and plan of care documented in the resident's note.

## 2022-09-10 ENCOUNTER — Other Ambulatory Visit (HOSPITAL_COMMUNITY): Payer: Self-pay

## 2022-09-13 LAB — TOXASSURE SELECT,+ANTIDEPR,UR

## 2022-09-30 ENCOUNTER — Other Ambulatory Visit (HOSPITAL_COMMUNITY): Payer: Self-pay

## 2022-10-11 ENCOUNTER — Telehealth: Payer: Self-pay

## 2022-10-11 NOTE — Telephone Encounter (Signed)
Pt is requesting a call back ... He is requesting his UDS  to be mailed to him

## 2022-10-11 NOTE — Telephone Encounter (Signed)
Return pt's call - requesting latest UDS result. I told pt we do not email but he can look it up on My Chart (it is active per chart). Stated he's unsure how to - I will mail it to his new address [address] [place] [address].

## 2022-10-15 ENCOUNTER — Other Ambulatory Visit (HOSPITAL_COMMUNITY): Payer: Self-pay

## 2022-10-15 ENCOUNTER — Other Ambulatory Visit: Payer: Self-pay

## 2022-10-16 ENCOUNTER — Other Ambulatory Visit (HOSPITAL_COMMUNITY): Payer: Self-pay

## 2022-10-16 ENCOUNTER — Ambulatory Visit (INDEPENDENT_AMBULATORY_CARE_PROVIDER_SITE_OTHER): Payer: Self-pay | Admitting: Student

## 2022-10-16 DIAGNOSIS — F1120 Opioid dependence, uncomplicated: Secondary | ICD-10-CM

## 2022-10-16 MED ORDER — BUPRENORPHINE HCL-NALOXONE HCL 8-2 MG SL SUBL
3.0000 | SUBLINGUAL_TABLET | Freq: Every day | SUBLINGUAL | 1 refills | Status: DC
  Filled 2022-10-16: qty 42, 14d supply, fill #0
  Filled 2022-10-30: qty 42, 14d supply, fill #1

## 2022-10-16 NOTE — Progress Notes (Signed)
   CC: OUD  This is a telephone encounter between Altamese Dilling and [employer] on 10/16/2022 for OUD. The visit was conducted with the patient located at home and Sanjuana Letters at [facility]. The patient's identity was confirmed using their DOB and current address. The patient has consented to being evaluated through a telephone encounter and understands the associated risks (an examination cannot be done and the patient may need to come in for an appointment) / benefits (allows the patient to remain at home, decreasing exposure to coronavirus). I personally spent 11 minutes on medical discussion.   HPI:  Mr.Nicole Viona Gilmore Richters is a 32 y.o. with PMH as below.   Please see A&P for assessment of the patient's acute and chronic medical conditions.   Past Medical History:  Diagnosis Date   Opioid use disorder    Review of Systems:   None    Assessment & Plan:   Severe opioid use disorder [facility]) Assessment: Telehealth visit for OUD follow up. Currently prescribed suboxone 8-2 TID tabs. He notes he dropped two tabs on the floor and they got wet, this is why he ran out early. HE has not had a tab today and is having some withdrawal symptoms. He tested positive for methamphetimenes during his last UDS and states he accidentally snorted meth when he was snorting his uncles suboxone.   We discussed not using others substances and not using methamphetamines. We addressed the goals of the clinic are to limit his use, he acknowledged this and is agreeable to inperson appointment.   Plan: - refilled suboxone - in person appointment in 2 weeks, repeat UDS    Patient discussed with Dr. Fanny Bien Internal Medicine Resident

## 2022-10-16 NOTE — Telephone Encounter (Signed)
Yes

## 2022-10-16 NOTE — Assessment & Plan Note (Signed)
Assessment: Telehealth visit for OUD follow up. Currently prescribed suboxone 8-2 TID tabs. He notes he dropped two tabs on the floor and they got wet, this is why he ran out early. HE has not had a tab today and is having some withdrawal symptoms. He tested positive for methamphetimenes during his last UDS and states he accidentally snorted meth when he was snorting his uncles suboxone.   We discussed not using others substances and not using methamphetamines. We addressed the goals of the clinic are to limit his use, he acknowledged this and is agreeable to inperson appointment.   Plan: - refilled suboxone - in person appointment in 2 weeks, repeat UDS

## 2022-10-16 NOTE — Telephone Encounter (Signed)
Jesus Keith was he calling for a refill of his suboxone?

## 2022-10-17 ENCOUNTER — Other Ambulatory Visit (HOSPITAL_COMMUNITY): Payer: Self-pay

## 2022-10-17 ENCOUNTER — Telehealth: Payer: Self-pay | Admitting: Internal Medicine

## 2022-10-24 NOTE — Progress Notes (Signed)
Internal Medicine Clinic Attending  Case discussed with Dr. Katsadouros  at the time of the visit.  We reviewed the resident's history and pertinent patient test results.  I agree with the assessment, diagnosis, and plan of care documented in the resident's note.  

## 2022-10-30 ENCOUNTER — Other Ambulatory Visit (HOSPITAL_COMMUNITY): Payer: Self-pay

## 2022-12-03 ENCOUNTER — Encounter: Payer: Self-pay | Admitting: Student

## 2022-12-05 ENCOUNTER — Other Ambulatory Visit: Payer: Self-pay | Admitting: *Deleted

## 2022-12-05 NOTE — Telephone Encounter (Signed)
Call from pt requesting telehealth appt ; stated he cannot come in b/c of his job; he's on a 90 day probation period. According to last appt which was a telehealth; he needs an in-person appt. Also he stated he needs a refill on Suboxone. Informed pt he might not get the refill unless he comes in but this will depend on the doctor. Then he asked if we had any early appts tomorrow. In-person appt schedule w/Dr Allena Katz tomorrow 5/17 @0845  AM.

## 2022-12-06 ENCOUNTER — Ambulatory Visit (INDEPENDENT_AMBULATORY_CARE_PROVIDER_SITE_OTHER): Payer: Self-pay | Admitting: Student

## 2022-12-06 ENCOUNTER — Other Ambulatory Visit (HOSPITAL_COMMUNITY): Payer: Self-pay

## 2022-12-06 VITALS — BP 122/71 | HR 83 | Temp 98.2°F | Wt 136.1 lb

## 2022-12-06 DIAGNOSIS — F1120 Opioid dependence, uncomplicated: Secondary | ICD-10-CM

## 2022-12-06 DIAGNOSIS — F1190 Opioid use, unspecified, uncomplicated: Secondary | ICD-10-CM

## 2022-12-06 MED ORDER — BUPRENORPHINE HCL-NALOXONE HCL 8-2 MG SL SUBL
3.0000 | SUBLINGUAL_TABLET | Freq: Every day | SUBLINGUAL | 1 refills | Status: DC
  Filled 2022-12-06: qty 42, 14d supply, fill #0

## 2022-12-06 NOTE — Patient Instructions (Signed)
Dear Jesus Keith,   It was a pleasure seeing you in clinic.  I have sent your suboxone to our pharmacy.  You'll have a telemedicine appointment in 2 weeks, then an in-person appointment 2 weeks after that.  Sincerely, Dr. Mercie Eon

## 2022-12-06 NOTE — Progress Notes (Deleted)
   CC: OUD follow-up  HPI:  JesusJesus Keith is a 32 y.o. male with past medical history of opioid use disorder coming in for OUD visit.  Medication currently includes Suboxone 8-2 3 tablets under tongue daily  Last visit telehealth: 10/16/2022 Suboxone refilled.  At that time he was having some withdrawal symptoms as he finished his Suboxone only a few drops.  The visit before patient had methamphetamine and his last drug screen  Most recent labs:  Tox assure 09/06/2022 showing methamphetamine as well as Suboxone  Plan for UDS today.  Past Medical History:  Diagnosis Date   Opioid use disorder      Current Outpatient Medications:    buprenorphine-naloxone (SUBOXONE) 8-2 mg SUBL SL tablet, Place 3 tablets under the tongue daily., Disp: 42 tablet, Rfl: 1   senna (SENOKOT) 8.6 MG TABS tablet, Take 1 tablet (8.6 mg total) by mouth daily., Disp: 90 tablet, Rfl: 0  Review of Systems:  ***  Constitutional: Eye: Respiratory: Cardiovascular: GI: MSK: GU: Skin: Neuro: Endocrine:   Physical Exam:  There were no vitals filed for this visit. *** General: Patient is sitting comfortably in the room  Eyes: Pupils equal and reactive to light, EOM intact  Head: Normocephalic, atraumatic  Neck: Supple, nontender, full range of motion, No JVD Cardio: Regular rate and rhythm, no murmurs, rubs or gallops. 2+ pulses to bilateral upper and lower extremities  Chest: No chest tenderness Pulmonary: Clear to ausculation bilaterally with no rales, rhonchi, and crackles  Abdomen: Soft, nontender with normoactive bowel sounds with no rebound or guarding  Neuro: Alert and orientated x3. CN II-XII intact. Sensation intact to upper and lower extremities. 2+ patellar reflex.  Back: No midline tenderness, no step off or deformities noted. No paraspinal muscle tenderness.  Skin: No rashes noted  MSK: 5/5 strength to upper and lower extremities.    Assessment & Plan:   No problem-specific  Assessment & Plan notes found for this encounter.    Patient {GC/GE:[id number]::"discussed with","seen with"} Dr. {NAMES:[id number]::"Guilloud","Hoffman","Mullen","Narendra","Williams","Vincent"}  Modena Slater, DO PGY-1 Internal Medicine Resident  Pager: [phone]

## 2022-12-06 NOTE — Progress Notes (Signed)
[facility]: Clinic Note  Subjective:  History of Present Illness: Jesus Keith is a 32 y.o. year old male who presents for OUD in person follow up.  He had a telemed visit with Dr. Evie Lacks on March 27.   He last filled suboxone on 10/30/22, was given 42 tablets for a 14-day supply. He has been taking 1/4 tab daily to stretch out his remaining few pills, last took 1/4 tab yesterday. He's having withdrawal symptoms of fatigue, upset stomach, and hot flashes today. He has used methamphetamine since his last telemed visit, but denies any other drug use.   He's very interested on being on suboxone regularly. He has a new job and is on the 90-day probation period, so has trouble missing work for in-person appointments.   He provided urine for Tox Assure today. Last Tox assure 09/06/2022 showing methamphetamine as well as Suboxone.    Please refer to Assessment and Plan below for full details in Problem-Based Charting.   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 No respiratory distress.  Psychiatric:     Comments: Normal mood and affect.      Data: Labs, imaging, and micro were reviewed in Epic. Refer to Assessment and Plan  below for full details in Problem-Based Charting.  Assessment & Plan:  Severe opioid use disorder (HCC) - Refill suboxone 8-2 TID tabs, 42 tabs for 14 day supply - 2 week follow-up telemedicine appointment - 4 week follow-up in person appointment - Tox Assure today - Work note provided today    Patient will follow up in 2 weeks for telemed, then 2 weeks after that for in person visit.   Mercie Eon, MD

## 2022-12-06 NOTE — Assessment & Plan Note (Signed)
-   Refill suboxone 8-2 TID tabs, 42 tabs for 14 day supply - 2 week follow-up telemedicine appointment - 4 week follow-up in person appointment - Tox Assure today - Work note provided today

## 2022-12-06 NOTE — Progress Notes (Deleted)
To Whom It May Concern,   Jesus Keith was seen in the [facility] today. Please excuse him from work this morning. He is safe to return to work.  If you have any questions, please don't hesitate to call.   Dr. Mercie Eon  [phone]

## 2022-12-11 LAB — TOXASSURE SELECT,+ANTIDEPR,UR

## 2023-01-07 ENCOUNTER — Other Ambulatory Visit: Payer: Self-pay

## 2023-01-07 ENCOUNTER — Other Ambulatory Visit (HOSPITAL_COMMUNITY): Payer: Self-pay

## 2023-01-07 ENCOUNTER — Ambulatory Visit (INDEPENDENT_AMBULATORY_CARE_PROVIDER_SITE_OTHER): Payer: Self-pay

## 2023-01-07 DIAGNOSIS — F1520 Other stimulant dependence, uncomplicated: Secondary | ICD-10-CM

## 2023-01-07 DIAGNOSIS — F1120 Opioid dependence, uncomplicated: Secondary | ICD-10-CM

## 2023-01-07 MED ORDER — BUPRENORPHINE HCL-NALOXONE HCL 8-2 MG SL SUBL
3.0000 | SUBLINGUAL_TABLET | Freq: Every day | SUBLINGUAL | 0 refills | Status: AC
  Filled 2023-01-07: qty 42, 14d supply, fill #0

## 2023-01-07 NOTE — Progress Notes (Signed)
  [facility] Internal Medicine Residency Telephone Encounter Continuity Care Appointment  HPI:  This telephone encounter was created for [name] on 01/07/2023 for the following purpose/cc ID follow-up.  Please see detailed assessment plan for HPI.   Past Medical History:  Past Medical History:  Diagnosis Date   Opioid use disorder      ROS:  Please see detailed assessment and plan for pertinent ROS.   Assessment / Plan / Recommendations:  Please see A&P under problem oriented charting for assessment of the patient's acute and chronic medical conditions.  As always, pt is advised that if symptoms worsen or new symptoms arise, they should go to an urgent care facility or to to ER for further evaluation. Amphetamine use disorder, moderate (HCC) Patient reports abstinence from methamphetamine since last discussion with [place]. -Continue to encourage abstinence -Follow-up tox assure in 2 weeks  Severe opioid use disorder [facility]) Patient presents via telehealth to discuss opioid use disorder and Suboxone therapy.  He is currently on Suboxone 8-2 mg sublingual tablet 3 times daily.  He was prescribed 14 days worth about 28 days ago but states that he had been saving up some tablets previously which bridged him to this appointment.  Last tox assure demonstrated Suboxone and methamphetamine.  However, patient states he has been able to abstain from methamphetamine since last telehealth appointment.  He denies cravings or relapse.  He reports that his constipation is manageable with Senokot. -Refill Suboxone 14 days -In person visit in 2 weeks with tox assure    Consent and Medical Decision Making:  Patient discussed with Dr. Criselda Peaches This is a telephone encounter between Jesus Keith and Jesus Keith on 01/07/2023 for OUD. The visit was conducted with the patient located at home and Jesus Keith at [facility] The patient's identity was confirmed using their DOB and current address. The  patient has consented to being evaluated through a telephone encounter and understands the associated risks (an examination cannot be done and the patient may need to come in for an appointment) / benefits (allows the patient to remain at home, decreasing exposure to coronavirus). I personally spent 15 minutes on medical discussion.

## 2023-01-07 NOTE — Assessment & Plan Note (Signed)
Patient presents via telehealth to discuss opioid use disorder and Suboxone therapy.  He is currently on Suboxone 8-2 mg sublingual tablet 3 times daily.  He was prescribed 14 days worth about 28 days ago but states that he had been saving up some tablets previously which bridged him to this appointment.  Last tox assure demonstrated Suboxone and methamphetamine.  However, patient states he has been able to abstain from methamphetamine since last telehealth appointment.  He denies cravings or relapse.  He reports that his constipation is manageable with Senokot. -Refill Suboxone 14 days -In person visit in 2 weeks with tox assure

## 2023-01-07 NOTE — Assessment & Plan Note (Signed)
Patient reports abstinence from methamphetamine since last discussion with [place]. -Continue to encourage abstinence -Follow-up tox assure in 2 weeks

## 2023-01-09 NOTE — Progress Notes (Signed)
Internal Medicine Clinic Attending  Case discussed with Dr. Cliffton Asters  at the time of the visit.  We reviewed the resident's history and pertinent patient test results.  I agree with the assessment, diagnosis, and plan of care documented in the resident's note.

## 2023-01-22 ENCOUNTER — Encounter: Payer: Self-pay | Admitting: Student
